# Patient Record
Sex: Male | Born: 1985 | Race: White | Hispanic: No
Health system: Western US, Academic
[De-identification: ages and names within clinical notes are randomized; demographics above are authoritative.]

## PROBLEM LIST (undated history)

## (undated) DIAGNOSIS — G43909 Migraine, unspecified, not intractable, without status migrainosus: Secondary | ICD-10-CM

## (undated) DIAGNOSIS — F909 Attention-deficit hyperactivity disorder, unspecified type: Secondary | ICD-10-CM

## (undated) HISTORY — DX: Migraine, unspecified, not intractable, without status migrainosus: G43.909

## (undated) HISTORY — PX: SURGICAL HX OTHER: 99

## (undated) HISTORY — DX: Attention-deficit hyperactivity disorder, unspecified type: F90.9

## (undated) HISTORY — PX: SHOULDER SURGERY: SHX246

## (undated) NOTE — ED Provider Notes (Signed)
Formatting of this note is different from the original.  URGENT CARE Intake Exam  [facility]    I evaluated Jose Richards in the [facility] via provider in triage.    HISTORY OF PRESENT ILLNESS     HPI  Jose Richards is a 89 year old male who presents to the Urgent Care today with cyst on the R groin. Reports cyst has been present x many years, but in the past few day the cyst has gotten bigger and painful. Denies any systemic complaints     PAST HISTORY     Past medical, surgical, family and social history were reviewed as presented in Patient Snapshot and EPIC records. No changes have been noted.    CURRENT MEDICATIONS:  Previous Medications    ACETAMINOPHEN (TYLENOL ORAL)    Take 750 mg by mouth 3 to 4 times daily as needed    RISPERIDONE (RISPERDAL) 0.25 MG TABLET    Take 1 tablet (0.25 mg) by mouth daily at bedtime , for anxiety and depression.     ALLERGIES:  Allergies   Allergen Reactions    Bee Sting [Venom-Honey Bee] Swelling     Allergies were verified with the patient by RN.    PHYSICAL EXAM     Vital Signs:      Visit Vitals  BP 138/83   Pulse 77   Temp 98.3 F (36.8 C) (Temporal)   Resp 22   Ht 1.702 m (5\' 7" )   Wt 61.2 kg (135 lb)   SpO2 99%   BMI 21.14 kg/m       Vital signs reviewed and show no significant abnormalities, afebrile, not hypoxic.    Constitutional: Generally healthy and well appearing is in no distress.  Head: Normocephalic and atraumatic.  ENT: Voice is normal. Hearing is intact.   Neck: Supple  Pulmonary/Chest: Breathing is unlabored. Able to speak in full sentences.  Neuropsych: Appropriate affect. Grossly neurologically intact. Mentation appears normal.  MSK: Moves all extremities. and No visible signs of injury.  Gait: Walks without assistance.  Skin: Not diaphoretic.     IMPRESSION     Newly enlarging and painful cyst, ?infected sebaceous cyst    PLAN     Medical Screening Exam Completion: I reviewed the patient?s chief  complaint, onset of symptoms, vital signs, level of distress, allergies and current medications.   The patient's RN triage note and any relevant medical history in Epic was reviewed. A focused medical exam was completed.  At this time, I cannot exclude an Emergency Medical Condition [facility] The patient will require further evaluation and care by an on-site urgent care clinician    Orders placed in triage: None    Veto Kemps, PA-C  Emergency Medical Services  [facility]  [facility]  06/08/2023  6:09 PM    This visit consisted of <10 minutes of interaction with the patient.      Electronically signed by Veto Kemps, PA-C at 06/08/2023  6:12 PM PDT

## (undated) NOTE — Progress Notes (Signed)
 Formatting of this note is different from the original.  Video Visit     Identification: Jose Richards is a 35 year old male.    ASSESSMENT/PLAN       Migraine with aura and without status migrainosus, not intractable  Start sumatriptan -25mg  and repeat in 2 hours if needed. Can start at 50mg  if needed. Max dose 100mg . Do not take more than 9 days per month.   Stop alcohol  Consider supplements as listed in AVS  Referral for acupuncture  Follow up in 2 weeks if not improving.       SUBJECTIVE/OBJECTIVE     Reason for Visit Questionnaire   Primary Reason for Visit: Headache   Symptom Description I have had worsening migraines and dizziness/vertigo over the last 3 weeks. I am also getting auras/notice slight brightness and color changes in my vision sometimes. These symptoms are more frequent and more severe than they have been    Prior History of This Concern Yes   Symptom Duration For one to four weeks   Symptom Frequency Constantly   Symptom Getting Better or Worse Worsening   Treatments Patient Has Tried Massage, NSAIDS, hydration. I used to take medication for it but stopped about 8 or so years ago because my symptoms were no longer frequent or disruptive to my life.   Possible Symptom Cause I don't know. I haven't been able to exercise very much recently, but that has never really impacted my headaches before.   Symptom Worries My headaches and vertigo haven't been this bad since they first started when I was a teenager and the vision alterations are new.   Other Medical Concerns No   Other Medical Concerns- Details       Review questionnaire responses    Started 3 weeks ago.   Had worse migraines he has ever had since they started at time of puberty x 2-3 years.   Had many years where he could just take tylenol/excedrin.   Then 3 weeks ago had aggressive symptoms. Migraine pain and vertigo. More severe auras (brightness, color changes). This hasn't happened since he was a teenager.   They are not really going  away. Some days with full day of headache. Sometimes will go away.   Can come and go through the day.   Having mild photophobia and phonophobia mildly today but has been worse at other times.   So far has tried tylenol/Excedrin.   He does have sometimes have vertigo without migraines.   No vomiting.   Normal appetite to slightly better than usual.   No weight loss.   Hasn't been exercising as much as usual for the last month -- due to back spasms and toe pain.   Still walks 4 miles a day.   No recent change to sleep, stress.   Does drink alcohol but has only been having a couple drinks a week over the last 3 weeks. Previously was drinking 2-3 drinks 4-5 nights per week  Has found acupuncture helpful before.     Imitrex and Topiramate in the past.         Provider was located at [facility] for this visit and the patient was located at [facility] in [facility].  A chaperone was not necessary for this exam.   A total of 25 minutes were spent on today?s encounter.  The patient reviewed and understands the [facility] telemedicine consent document and has discussed any related concerns or questions during the visit.  Electronically signed by Madelyn Brunner, MD at 08/08/2023  9:47 AM PDT

## (undated) NOTE — Progress Notes (Signed)
 Formatting of this note is different from the original.  Provider needs to reconcile: None    Patient's main concern: Stomach Problem (Pain/burning constant in the middle spreading to top, )     Other needs or issues hoping to address?   none    Care reminders were reviewed:  Yes: patient declined    Chaperone for Sensitive Physical Exams (In Office):  Not Applicable    No orders of the defined types were placed in this encounter.    eCheck-In Action/Status       eCheck-in Steps Patient Action Taken Status    Personal Information Completed Completed    Insurance  Filtered    Payments Completed Completed    ESign Documents  Not Offered    Questionnaires Completed Completed    Medications Verified Completed    Allergies Verified Completed    Barcode  Completed         Assigned Appointment Questionnaires and Status       Assigned Questionnaires Status    REASON FOR VISIT SELECTION [facility]) Completed           Medications reviewed:       Thu Nov 18, 2022  7:09 AM       Allergies reviewed:         Nov 18, 2022  7:09 AM       Questionnaires completed: Review questionnaire responses  Smoking Tobacco Use: Former  Nurse, mental health by Verna Czech at 01/15/2023 12:03 PM PDT

## (undated) NOTE — Progress Notes (Signed)
 Formatting of this note is different from the original.  Office Visit    Assessment and Plan:       Patient Instructions   For stomach, sounds like irritation, try over the counter pepcid/famotidine, 20 mg-40 mg once or twice a day.   Caution w/ alcohol. Tums good for quick relief, pepcid slower/longer.     Achilles attachment pain, try diclofenac gel and on any painful spots  Re activity, "let pain be your guide".       Health Maintenance   Topic Date Due    Adult HIV Screen (1-time)  Never done    Hep C Screening (1-time)  Never done    Vaccine: COVID-19 (4 - 2023-24 season) 06/18/2022    Depression F/U: [facility] Monitoring Tool  01/06/2023    Blood Pressure Check  09/07/2025    Vaccine: DTaP-Tdap-Td (2 - Td or Tdap) 08/23/2028    FLU VACCINE  Completed     FOLLOW-UP PLAN:    See AVS      Subjective:   Patient ID: Jose Richards is a 27 year old male     Reason for Visit Questionnaire   Primary Reason for Visit: Other   Symptom Description Foot/heal pain, knee pain    Prior History of This Concern Yes   Symptom Duration For more than a month   Symptom Frequency Sometimes   Symptom Getting Better or Worse Staying the same   Treatments Patient Has Tried Ice, stretching, steroid cream   Possible Symptom Cause It's usually just a light pain or ache, but I also get stabbing pain near the outside base of my heal.   Symptom Worries It affects my ability to exercise and it is consistently painful.   Other Medical Concerns Yes   Other Medical Concerns- Details Had stomach burning and abdominal pain for a week, but it resolved itself a few days ago     Review questionnaire responses  Chief Complaint   Patient presents with    Stomach Problem     Stomach Pain/burning constant in the middle spreading to top, for 1-2 weeks     Chronic anxiety  After some alcohol, vomited, mid and upper abd pain, heartburn, went away.   Took Tums, then Pepto Bismol, Tums helped a little.     Achilles insertion area pain, No redness,  swelling, or heat.  \no injury there    Medications, Family and Social History per Epic    Objective:   BP 118/73 (BP Location: Left arm, Patient Position: Sitting)   Pulse 86   Wt 143 lb 14.4 oz (65.3 kg)   SpO2 100%   BMI 22.21 kg/m     Physical Exam  Mild epigastric pain  Right achilles insertion pain, No redness, swelling, or heat.    Full ankle range of motion, no calf muscle pain    A total of 22 minutes were spent on today's encounter.   Time may include direct patient care, reviewing pertinent records & follow-up activity.       Electronically signed by George Hugh, MD at 01/15/2023 12:03 PM PDT

## (undated) NOTE — ED Provider Notes (Signed)
Formatting of this note is different from the original.  SUBJECTIVE:  16 yo male with several days of increasing redness, swelling and pain in right groin region.  He describes having a marble sized nodule in that area for many years that had not been a problem before.  No fevers.      OBJECTIVE:  Visit Vitals  BP 138/83   Pulse 77   Temp 98.3 F (36.8 C) (Temporal)   Resp 22   Ht 1.702 m (5\' 7" )   Wt 61.2 kg (135 lb)   SpO2 99%   BMI 21.14 kg/m     Appears well NAD  Focused exam:  right groin region  - 3-4 cm area of swelling with central fluctuance and redness  - erythema extending superioriorly about 2 additional cm   - very tender    PROCEDURE: Inicision and Drainage of right inguinal sebaceous cyst / abscess  Risks benefits and alternatives explained and Jose Richards gives verbal consent to proceed.  Local anesthesia achieved with 3 ml of 1% lidocaine.  Site is sterilely prepped and draped.  Incision made with #11 blade approximately 2 cm.  grey pus extruded, sent for culture.  Abscess cavity is explored with kelly clamp, loculations broken.  The absces is then packed with sterile gauze.  Jose Richards tolerated the procedure well.  Estimated blood loss is negligible.    ASSESSMENT:  49 yo with infected sebaceous cyst/abscess.   I&D done.       PLAN:  Augmentin to cover likely GI related microbes in that region, cultures pending, wound care, see AVS    Jeannene Patella, MD        Electronically signed by Jeannene Patella, MD at 06/09/2023  6:11 PM PDT

## (undated) NOTE — Assessment & Plan Note (Signed)
 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  Start sumatriptan -25mg  and repeat in 2 hours if needed. Can start at 50mg  if needed. Max dose 100mg . Do not take more than 9 days per month.   Stop alcohol  Consider supplements as listed in AVS  Referral for acupuncture  Follow up in 2 weeks if not improving.   Electronically signed by Madelyn Brunner, MD at 08/08/2023  9:47 AM PDT

## (undated) NOTE — ED Notes (Signed)
Formatting of this note might be different from the original.  Pt here for abscess to R groin. Reports site has always had painless cyst, but recently became inflamed and enlarged and painful. No treatment PTA.  Electronically signed by Verlee Rossetti, RN at 06/08/2023  6:13 PM PDT

## (undated) NOTE — ED Notes (Signed)
Formatting of this note might be different from the original.  Discharged by provider without further intervention by RN.    Chanetta Marshall, RN  Electronically signed by Chanetta Marshall, RN at 06/08/2023 10:41 PM PDT

---

## 2014-08-23 ENCOUNTER — Ambulatory Visit (INDEPENDENT_AMBULATORY_CARE_PROVIDER_SITE_OTHER): Payer: BC Managed Care – PPO | Admitting: Family Medicine

## 2014-08-23 VITALS — BP 106/70 | HR 80 | Temp 98.1°F | Resp 18 | Ht 66.5 in | Wt 130.6 lb

## 2014-08-23 DIAGNOSIS — J208 Acute bronchitis due to other specified organisms: Secondary | ICD-10-CM

## 2014-08-23 MED ORDER — AZITHROMYCIN 250 MG PO TABS: ORAL_TABLET | ORAL | Status: DC

## 2014-08-23 MED ORDER — IPRATROPIUM BROMIDE 0.03 % NA SOLN: 2.0000 | Freq: Four times a day (QID) | NASAL | Status: DC

## 2014-08-23 NOTE — Progress Notes (Signed)
[facility] [address], [place] [phone]  Date:  08/23/2014   Name:  Richard OttoMatthew Reese   DOB:  April 26, 1986   MRN:  [id number]  PCP:  No PCP Per Patient    Chief Complaint: Cough; Headache; and Fatigue   History of Present Illness:  Richard OttoMatthew Reese is a 28 y.o. very pleasant male patient who presents with the following:  Here today as a new patient with illness.   He has been ill for about one month.  He thought he had a sinus infection about one month ago with ST, hoarse voice, cough which could be productive. He got somewhat better, but was never 100%.  Sx have come and gone over the last few weeks. ST is now resolved.  He is not sure if he ran a fever- he has felt warm, but nothing significant.   No unusual GI sx, but he has noted some loose stools  No persistent body aches or chills.  He has had some upper back pain but he sees a chiropractor for back pain.   He has noted some dry eyes.  Did have sneezing but this is now resolved.   He is generally in good health   There are no active problems to display for this patient.   Past Medical History  Diagnosis Date  . Migraine   . ADHD (attention deficit hyperactivity disorder)     Past Surgical History  Procedure Laterality Date  . Shoulder surgery      History  Substance Use Topics  . Smoking status: Current Some Day Smoker  . Smokeless tobacco: Not on file  . Alcohol Use: 3.6 oz/week    0 Not specified, 6 Cans of beer per week    Family History  Problem Relation Age of Onset  . Kidney disease Paternal Grandfather   . Cancer Paternal Grandfather     No Known Allergies  Medication list has been reviewed and updated.  No current outpatient prescriptions on file prior to visit.   No current facility-administered medications on file prior to visit.    Review of Systems:  As per HPI- otherwise negative.   Physical Examination: Filed Vitals:   08/23/14 0843   BP: 106/70  Pulse: 80  Temp: 98.1 F (36.7 C)  Resp: 18   Filed Vitals:   08/23/14 0843  Height: 5' 6.5" (1.689 m)  Weight: 130 lb 9.6 oz (59.24 kg)   Body mass index is 20.77 kg/(m^2). Ideal Body Weight: Weight in (lb) to have BMI = 25: 156.9  GEN: WDWN, NAD, Non-toxic, A & O x 3, looks well, slim HEENT: Atraumatic, Normocephalic. Neck supple. No masses, No LAD.  Bilateral TM wnl, oropharynx normal.  PEERL,EOMI.   Ears and Nose: No external deformity. CV: RRR, No M/G/R. No JVD. No thrill. No extra heart sounds. PULM: CTA B, no wheezes, crackles, rhonchi. No retractions. No resp. distress. No accessory muscle use. EXTR: No c/c/e NEURO Normal gait.  PSYCH: Normally interactive. Conversant. Not depressed or anxious appearing.  Calm demeanor.    Assessment and Plan: Acute bronchitis due to other specified organisms - Plan: azithromycin (ZITHROMAX) 250 MG tablet, ipratropium (ATROVENT) 0.03 % nasal spray  Persistent cough for a month,  Will treat with azithromycin and atrovent as needed for PND.  Asked him to follow-up if not better in the next few days  Signed Abbe AmsterdamJessica Machele Deihl, MD

## 2014-08-23 NOTE — Patient Instructions (Signed)
Use the atrovent nasal spray as needed for your nasal drainage and sore throat.  Use the azithromycin(9antibioic) as directed for your bronchitis.  Let me know if you are not better soon!

## 2015-01-06 ENCOUNTER — Ambulatory Visit (INDEPENDENT_AMBULATORY_CARE_PROVIDER_SITE_OTHER): Payer: BLUE CROSS/BLUE SHIELD

## 2015-01-06 ENCOUNTER — Ambulatory Visit (INDEPENDENT_AMBULATORY_CARE_PROVIDER_SITE_OTHER): Payer: BLUE CROSS/BLUE SHIELD | Admitting: Family Medicine

## 2015-01-06 VITALS — BP 122/72 | HR 70 | Temp 98.0°F | Resp 17 | Ht 66.0 in | Wt 132.0 lb

## 2015-01-06 DIAGNOSIS — R1030 Lower abdominal pain, unspecified: Secondary | ICD-10-CM

## 2015-01-06 DIAGNOSIS — K6289 Other specified diseases of anus and rectum: Secondary | ICD-10-CM | POA: Diagnosis not present

## 2015-01-06 DIAGNOSIS — R194 Change in bowel habit: Secondary | ICD-10-CM

## 2015-01-06 DIAGNOSIS — R312 Other microscopic hematuria: Secondary | ICD-10-CM | POA: Diagnosis not present

## 2015-01-06 DIAGNOSIS — R3129 Other microscopic hematuria: Secondary | ICD-10-CM

## 2015-01-06 DIAGNOSIS — Z800 Family history of malignant neoplasm of digestive organs: Secondary | ICD-10-CM | POA: Diagnosis not present

## 2015-01-06 LAB — POCT UA - MICROSCOPIC ONLY
Bacteria, U Microscopic: NEGATIVE
Casts, Ur, LPF, POC: NEGATIVE
Crystals, Ur, HPF, POC: NEGATIVE
Epithelial cells, urine per micros: NEGATIVE
WBC, Ur, HPF, POC: NEGATIVE
Yeast, UA: NEGATIVE

## 2015-01-06 LAB — POCT CBC
Granulocyte percent: 70 % (ref 37–80)
HCT, POC: 48.2 % (ref 43.5–53.7)
Hemoglobin: 15.9 g/dL (ref 14.1–18.1)
Lymph, poc: 1.9 (ref 0.6–3.4)
MCH, POC: 28.1 pg (ref 27–31.2)
MCHC: 33.1 g/dL (ref 31.8–35.4)
MCV: 85 fL (ref 80–97)
MID (cbc): 0.5 (ref 0–0.9)
MPV: 7.4 fL (ref 0–99.8)
POC Granulocyte: 5.7 (ref 2–6.9)
POC LYMPH PERCENT: 23.7 %L (ref 10–50)
POC MID %: 6.3 %M (ref 0–12)
Platelet Count, POC: 226 10*3/uL (ref 142–424)
RBC: 5.67 M/uL (ref 4.69–6.13)
RDW, POC: 12.4 %
WBC: 8.1 10*3/uL (ref 4.6–10.2)

## 2015-01-06 LAB — POCT URINALYSIS DIPSTICK
Bilirubin, UA: NEGATIVE
Glucose, UA: NEGATIVE
Ketones, UA: NEGATIVE
Leukocytes, UA: NEGATIVE
Nitrite, UA: NEGATIVE
Protein, UA: NEGATIVE
Spec Grav, UA: 1.02
Urobilinogen, UA: 0.2
pH, UA: 7.5

## 2015-01-06 NOTE — Patient Instructions (Addendum)
Proctalgia Fugax Proctalgia fugax is a very short episode of intense rectal pain. It can last from seconds to minutes. It often occurs in the night, and awakens the person from sleep. It is not a sign of cancer.  CAUSES  The cause of this often intense rectal pain is not known. One possible cause may be spasm of the pelvic muscles or of the lowest part of the large intestine.  SYMPTOMS  The pain of proctalgia fugax:  Is intensely severe.  Lasts from only a few seconds to thirty minutes.  Usually awakens the person from sleep. DIAGNOSIS  In order to make sure that there are no other problems, diagnostic tests may be done such as:   Anoscopy. This is a lighted scope that is put into the rectum to look for abnormalities.  Barium enema. X-rays are taken after administering a radio-sensitive material. TREATMENT  A number of things have been used to try to treat this condition, including:  Medications.  Warm baths.  Relaxation techniques.  Gentle massage of the painful area. HOME CARE INSTRUCTIONS   Take all medications exactly as directed.  Follow any prescribed diet.  Follow instructions regarding both rest and physical activity.  Learn progressive relaxation techniques. SEEK IMMEDIATE MEDICAL CARE IF:   Your pain does not get better in the usual amount of time.  You develop any new symptoms. Document Released: 06/29/2001 Document Revised: 12/27/2011 Document Reviewed: 12/05/2008 [facility] Patient Information 2015 [employer] [place]. This information is not intended to replace advice given to you by your health care provider. Make sure you discuss any questions you have with your health care provider. Hematuria Hematuria is blood in your urine. It can be caused by a bladder infection, kidney infection, prostate infection, kidney stone, or cancer of your urinary tract. Infections can usually be treated with medicine, and a kidney stone usually will pass through your urine. If  neither of these is the cause of your hematuria, further workup to find out the reason may be needed. It is very important that you tell your health care provider about any blood you see in your urine, even if the blood stops without treatment or happens without causing pain. Blood in your urine that happens and then stops and then happens again can be a symptom of a very serious condition. Also, pain is not a symptom in the initial stages of many urinary cancers. HOME CARE INSTRUCTIONS   Drink lots of fluid, 3-4 quarts a day. If you have been diagnosed with an infection, cranberry juice is especially recommended, in addition to large amounts of water.  Avoid caffeine, tea, and carbonated beverages because they tend to irritate the bladder.  Avoid alcohol because it may irritate the prostate.  Take all medicines as directed by your health care provider.  If you were prescribed an antibiotic medicine, finish it all even if you start to feel better.  If you have been diagnosed with a kidney stone, follow your health care provider's instructions regarding straining your urine to catch the stone.  Empty your bladder often. Avoid holding urine for long periods of time.  After a bowel movement, women should cleanse front to back. Use each tissue only once.  Empty your bladder before and after sexual intercourse if you are a male. SEEK MEDICAL CARE IF:  You develop back pain.  You have a fever.  You have a feeling of sickness in your stomach (nausea) or vomiting.  Your symptoms are not better in 3 days.  Return sooner if you are getting worse. SEEK IMMEDIATE MEDICAL CARE IF:   You develop severe vomiting and are unable to keep the medicine down.  You develop severe back or abdominal pain despite taking your medicines.  You begin passing a large amount of blood or clots in your urine.  You feel extremely weak or faint, or you pass out. MAKE SURE YOU:   Understand these  instructions.  Will watch your condition.  Will get help right away if you are not doing well or get worse. Document Released: 10/04/2005 Document Revised: 02/18/2014 Document Reviewed: 06/04/2013 [facility] Patient Information 2015 [place], [place]. This information is not intended to replace advice given to you by your health care provider. Make sure you discuss any questions you have with your health care provider.

## 2015-01-06 NOTE — Progress Notes (Signed)
Chief Complaint:  Chief Complaint  Patient presents with  . Abdominal Pain  . Rectal Pain  . Diarrhea    HPI: Richard Reese is a 29 y.o. male who is here for  several complaints.   1. Last couple of months he has had GI discomfort, colon cancer runs in the family, throbbing pain in rectal area, consistency changes in BM, very hard or very loose, he feels like he always has to go to bathroom. He ahs had colonscopy, when he was 13 or 14. He does not see one currently for any GI issues, he has not seen one since age 52. Grandfather was dx with colon cancer in his 30s. Father and mother do not have colon cancer. He has had a lot of stress at work. He works for [employer]. He is a Data processing manager. He has not had any bloody stools.Color of stool has been normal however he has had rectal pain. Denies any STDs. He thought he might have had a hemorrhoid. Stated that he felt like there was a bulge. 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 He had rectal pain A couple of times in a couple of months,  but recently has been more constant so that is why he wants to come in and see what is going on. It was not consistent. In the last week has been more consistent and frequent. There is no family history that he knows of of inflammatory bowel disease, irritable bowel syndrome. However when he called his father he was told that both his grandfather and his father has irritable bowel syndrome.  2. He has migraines with vertigo and nausea and dizziness is associated with this he does not think this is any different.  3. Has lost 4 lbs not extreme , in last 3-4 months. He states it has been unintentional.He has not changed his diet recently.     Wt Readings from Last 3 Encounters:  01/06/15 132 lb (59.875 kg)  08/23/14 130 lb 9.6 oz (59.24 kg)     Past Medical History  Diagnosis Date  . Migraine     . ADHD (attention deficit hyperactivity disorder)    Past Surgical History  Procedure Laterality Date  . Shoulder surgery     History   Social History  . Marital Status: Single    Spouse Name: N/A  . Number of Children: N/A  . Years of Education: N/A   Social History Main Topics  . Smoking status: Former Smoker    Quit date: 11/04/2014  . Smokeless tobacco: Not on file  . Alcohol Use: 3.6 oz/week    0 Standard drinks or equivalent, 6 Cans of beer per week  . Drug Use: Yes    Special: Marijuana  . Sexual Activity: Yes   Other Topics Concern  . None   Social History Narrative   Family History  Problem Relation Age of Onset  . Kidney disease Paternal Grandfather   . Cancer Paternal Grandfather    No Known Allergies Prior to Admission medications   Not on File     ROS: The patient denies fevers, chills, night sweats,  chest pain, palpitations, wheezing, dyspnea on exertion, dysuria, hematuria, melena, numbness, weakness, or tingling.   All other systems have been reviewed and were otherwise negative with the exception of those mentioned in the HPI and as above.  PHYSICAL EXAM: Filed Vitals:   01/06/15 1024  BP: 122/72  Pulse: 70  Temp: 98 F (36.7 C)  Resp: 17   Filed Vitals:   01/06/15 1024  Height:  (1.676 m)  Weight: 132 lb (59.875 kg)   Body mass index is 21.32 kg/(m^2).  General: Alert, no acute distress HEENT:  Normocephalic, atraumatic, oropharynx patent. EOMI, PERRLA Cardiovascular:  Regular rate and rhythm, no rubs murmurs or gallops.  No Carotid bruits, radial pulse intact. No pedal edema.  Respiratory: Clear to auscultation bilaterally.  No wheezes, rales, or rhonchi.  No cyanosis, no use of accessory musculature GI: No organomegaly, abdomen is soft and non-tender, positive bowel sounds.  No masses. Skin: No rashes. Neurologic: Facial musculature symmetric. Psychiatric: Patient is appropriate throughout our interaction. He was a  little bit nervous. Lymphatic: No cervical lymphadenopathy Musculoskeletal: Gait intact. GU-rectal exam was normal. No prostate date hypertrophy, no fissures in the perirectal area, no evidence of internal or external hemorrhoids or anal warts. Testicular exam normal negative for inguinal hernias.   LABS: Results for orders placed or performed in visit on 01/06/15  POCT CBC  Result Value Ref Range   WBC 8.1 4.6 - 10.2 K/uL   Lymph, poc 1.9 0.6 - 3.4   POC LYMPH PERCENT 23.7 10 - 50 %L   MID (cbc) 0.5 0 - 0.9   POC MID % 6.3 0 - 12 %M   POC Granulocyte 5.7 2 - 6.9   Granulocyte percent 70.0 37 - 80 %G   RBC 5.67 4.69 - 6.13 M/uL   Hemoglobin 15.9 14.1 - 18.1 g/dL   HCT, POC 84.6 96.2 - 53.7 %   MCV 85.0 80 - 97 fL   MCH, POC 28.1 27 - 31.2 pg   MCHC 33.1 31.8 - 35.4 g/dL   RDW, POC 95.2 %   Platelet Count, POC 226 142 - 424 K/uL   MPV 7.4 0 - 99.8 fL  POCT UA - Microscopic Only  Result Value Ref Range   WBC, Ur, HPF, POC neg    RBC, urine, microscopic 0-4    Bacteria, U Microscopic neg    Mucus, UA moderate    Epithelial cells, urine per micros neg    Crystals, Ur, HPF, POC neg    Casts, Ur, LPF, POC neg    Yeast, UA neg   POCT urinalysis dipstick  Result Value Ref Range   Color, UA yellow    Clarity, UA clear    Glucose, UA neg    Bilirubin, UA neg    Ketones, UA neg    Spec Grav, UA 1.020    Blood, UA tr-intact    pH, UA 7.5    Protein, UA neg    Urobilinogen, UA 0.2    Nitrite, UA neg    Leukocytes, UA Negative      EKG/XRAY:   Primary read interpreted by Dr. Conley Rolls at [facility]. ? Ureter stones vs bladder phleboltihs   ASSESSMENT/PLAN: Encounter Diagnoses  Name Primary?  . Lower abdominal pain Yes  . Rectal pain   . Bowel habit changes   . Microscopic hematuria   . Family history of colon cancer    Pleasant 29 year old gentleman with vague lower abdominal symptoms possibly consistent with ureter stones which were incidentally found versus possible IBS  symptoms He had a history of abdominal symptoms and was seen by [place] as a GI specialist has a young child. His father and grandfather both have irritable bowel syndrome. His  grandfather also had colon cancer in his mid 1730s. He declines to be referred to a GI specialist at this time for further evaluation for family history of colon cancer. I will not treat him right now for her stones unless x-rays come back confirming it. If it is positive then will consider Flomax. He states he has had microscopic hematuria in the past, has not seen urology for this effort. Was a former smoker. Labs pending: CMP, lipase, also will turn in a hemosure, and I will order a UA with micro for repeat urine testing in one week. If he continues to have hematuria without evidence of renal stone then he needs to be referred to urology. Follow-up with lab send official x-ray results by phone.   Gross sideeffects, risk and benefits, and alternatives of medications d/w patient. Patient is aware that all medications have potential sideeffects and we are unable to predict every sideeffect or drug-drug interaction that may occur.  Hamilton CapriLE, Roberta Angell PHUONG, DO 01/06/2015 12:56 PM

## 2015-01-07 LAB — COMPLETE METABOLIC PANEL WITH GFR
AST: 20 U/L (ref 0–37)
Alkaline Phosphatase: 69 U/L (ref 39–117)
BUN: 14 mg/dL (ref 6–23)
CO2: 32 mEq/L (ref 19–32)
Calcium: 10.5 mg/dL (ref 8.4–10.5)
Creat: 0.7 mg/dL (ref 0.50–1.35)
GFR, Est Non African American: >89 mL/min
Glucose, Bld: 103 mg/dL — ABNORMAL HIGH (ref 70–99)
Potassium: 4.7 mEq/L (ref 3.5–5.3)
Sodium: 142 mEq/L (ref 135–145)
Total Bilirubin: 0.4 mg/dL (ref 0.2–1.2)

## 2015-01-07 LAB — COMPLETE METABOLIC PANEL WITHOUT GFR
ALT: 16 U/L (ref 0–53)
Albumin: 5.6 g/dL — ABNORMAL HIGH (ref 3.5–5.2)
Chloride: 103 meq/L (ref 96–112)
GFR, Est African American: >89 mL/min
Total Protein: 7.9 g/dL (ref 6.0–8.3)

## 2015-01-07 LAB — LIPASE: Lipase: 22 U/L (ref 0–75)

## 2015-01-09 ENCOUNTER — Encounter: Payer: Self-pay | Admitting: Family Medicine

## 2015-01-16 ENCOUNTER — Other Ambulatory Visit (INDEPENDENT_AMBULATORY_CARE_PROVIDER_SITE_OTHER): Payer: BLUE CROSS/BLUE SHIELD

## 2015-01-16 DIAGNOSIS — R312 Other microscopic hematuria: Secondary | ICD-10-CM

## 2015-01-16 DIAGNOSIS — R3129 Other microscopic hematuria: Secondary | ICD-10-CM

## 2015-01-29 ENCOUNTER — Telehealth: Payer: Self-pay | Admitting: Family Medicine

## 2015-01-29 LAB — POCT URINALYSIS DIPSTICK
Bilirubin, UA: NEGATIVE
Glucose, UA: NEGATIVE
Ketones, UA: NEGATIVE
Leukocytes, UA: NEGATIVE
Nitrite, UA: NEGATIVE
Protein, UA: NEGATIVE
Spec Grav, UA: <=1.005
Urobilinogen, UA: 0.2
pH, UA: 6.5

## 2015-01-29 LAB — POCT UA - MICROSCOPIC ONLY
Bacteria, U Microscopic: NEGATIVE
Casts, Ur, LPF, POC: NEGATIVE
Crystals, Ur, HPF, POC: NEGATIVE
Epithelial cells, urine per micros: NEGATIVE
Mucus, UA: NEGATIVE
RBC, urine, microscopic: NEGATIVE
WBC, Ur, HPF, POC: NEGATIVE
Yeast, UA: NEGATIVE

## 2015-01-29 LAB — IFOBT (OCCULT BLOOD): IFOBT: NEGATIVE

## 2015-01-29 NOTE — Telephone Encounter (Signed)
Dr. Conley RollsLe reviewed his labs on 3/24, and sent him a letter with the results and this message:  "Here are your labs as we discussed over the phone.  Your electrolytes, kidneys, liver, pancreas are normal.Please follow up as directed."

## 2015-01-29 NOTE — Telephone Encounter (Signed)
Pt calling about labs done on 3/31. I am consulting with lab about this.

## 2015-01-29 NOTE — Telephone Encounter (Signed)
Pt saw dr Conley RollsLe on 3/21. He had labs and it doesn't look like they were ever reviewed. Can you review them in her absence please? Thanks!

## 2015-01-29 NOTE — Telephone Encounter (Signed)
Please call patient with his lab results. States that he was seen weeks ago and still has not heard anything.   [phone]

## 2015-01-30 ENCOUNTER — Telehealth: Payer: Self-pay | Admitting: Family Medicine

## 2015-01-30 ENCOUNTER — Other Ambulatory Visit: Payer: Self-pay | Admitting: Radiology

## 2015-01-30 DIAGNOSIS — R3129 Other microscopic hematuria: Secondary | ICD-10-CM

## 2015-01-30 NOTE — Telephone Encounter (Signed)
-----   Message from Lenell Antuhao P Le, DO sent at 01/29/2015 11:30 PM EDT ----- Regarding: FW: Still has some trace microscopic blood in urine, recommend urology referral if he wants referral then we can refer him and if not then we will not. Thanks Dr Conley RollsLe ----- Message -----    From: Johnnette LitterErin M Cardwell, CMA    Sent: 01/29/2015   1:52 PM      To: Thao P Le, DO

## 2015-01-30 NOTE — Telephone Encounter (Signed)
Made referral. Pt notified.

## 2015-01-30 NOTE — Telephone Encounter (Signed)
Patient states that if your ok with him not going that would be fine BUT if you really think he need to go then he will go but he's been watching his urine to make sure there is no urine

## 2015-01-30 NOTE — Telephone Encounter (Signed)
-----   Message from Lenell Antuhao P Le, DO sent at 01/29/2015 11:27 PM EDT ----- Regarding: FW: Negative occult blood in stool ----- Message -----    From: Lab in [facility]    Sent: 01/07/2015   2:28 AM      To: Thao P Le, DO

## 2015-01-30 NOTE — Telephone Encounter (Signed)
Spoke with patient gave him results of stool test and urine

## 2015-01-30 NOTE — Telephone Encounter (Signed)
Let him know I would recommend it since he continues to have microscopic blood in urine. Please refer him.

## 2015-02-03 NOTE — Telephone Encounter (Signed)
I see no labs done 3/31. There was the OV on 3/21, and then another on 4/13.  See previous note regarding the lab results from the 3/21 visit. On 4/13 he had:   iFOBT (NEGATIVE) UA with micro (NORMAL)  The tests on 4/13 were resulted IN THE OFFICE, so I'm not sure why he didn't get those results while he was here, unless he took the iFOBT kit home.

## 2015-02-04 NOTE — Telephone Encounter (Signed)
This has already been cleared up with pt.

## 2016-11-29 IMAGING — CR DG ABDOMEN 1V
1 series · 1 of 1 positions shown · non-contrast
Comparison: None.

CLINICAL DATA: Lower abdominal pain, rectal pain, bowel habits have
changed

EXAM:
ABDOMEN - 1 VIEW

[AP]
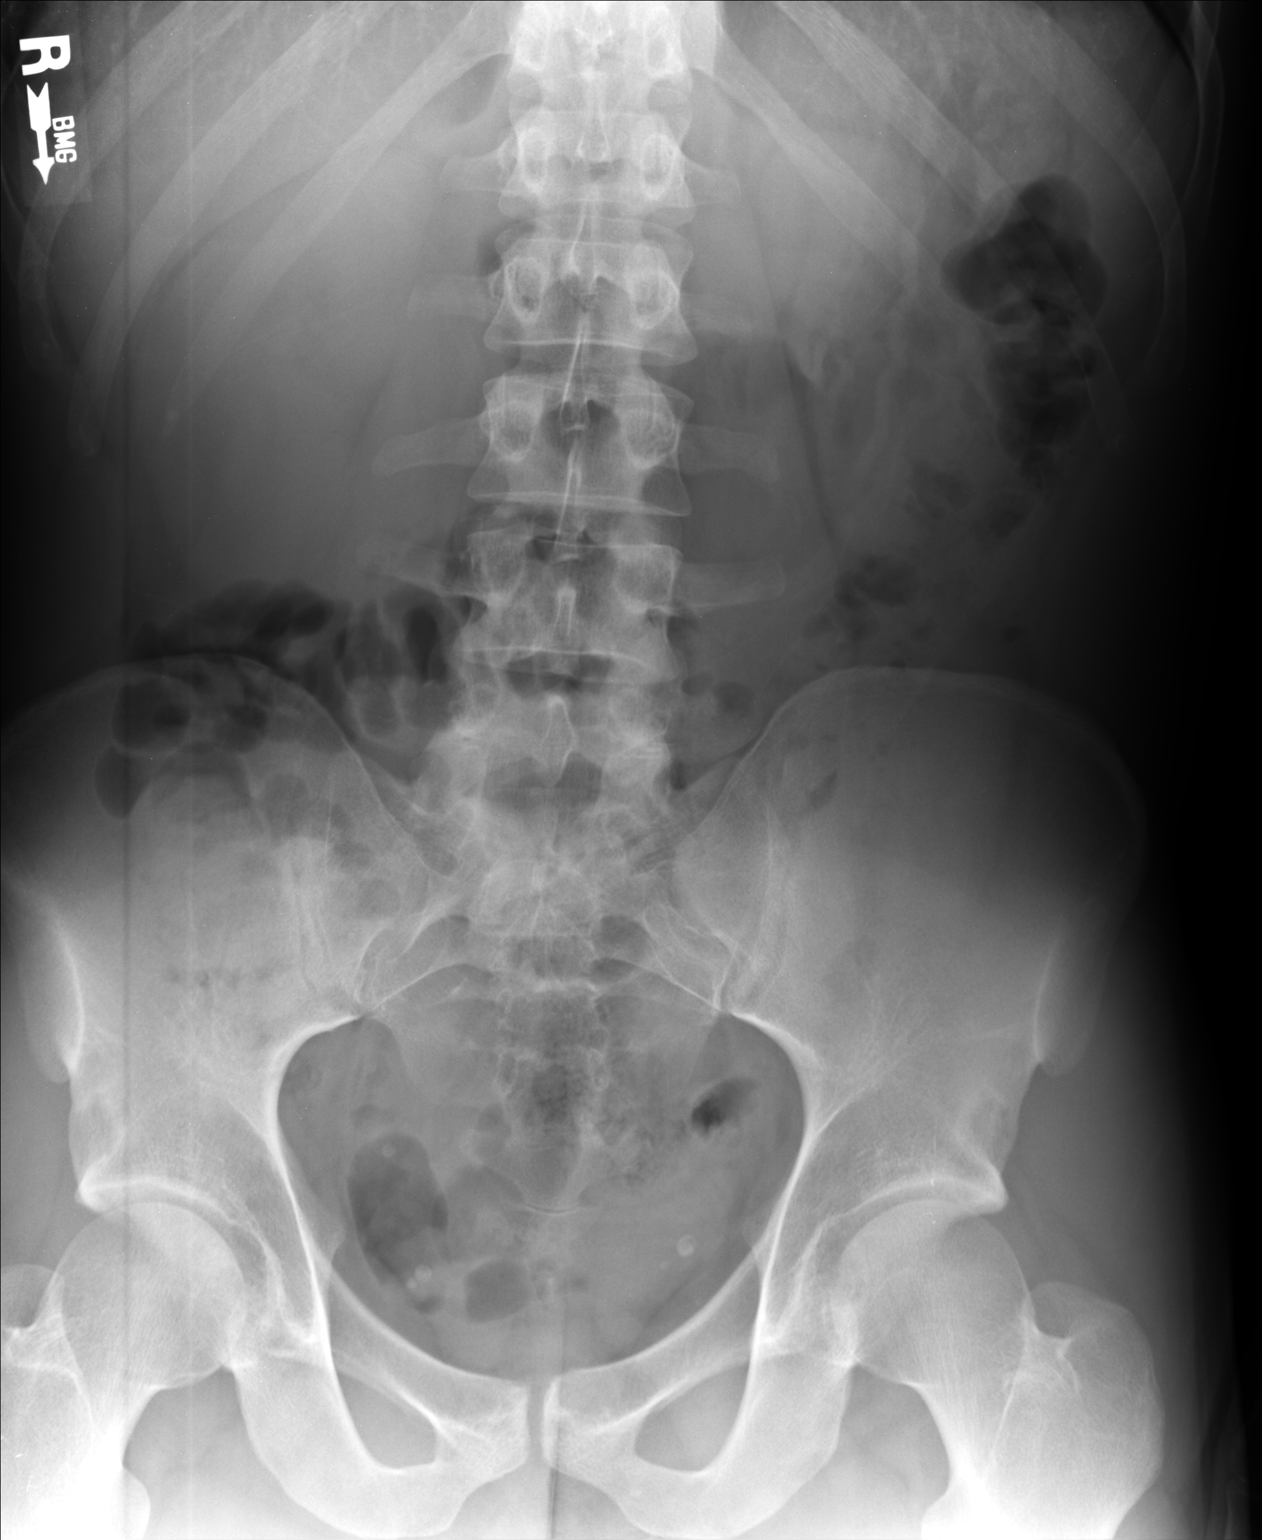

[1 of 1 positions shown; findings below may reference images not displayed]

FINDINGS: The bowel gas pattern is normal. There are multiple pelvic
phleboliths. No radio-opaque calculi or other significant
radiographic abnormality are seen.
IMPRESSION: Negative.

## 2018-08-23 ENCOUNTER — Encounter (INDEPENDENT_AMBULATORY_CARE_PROVIDER_SITE_OTHER): Payer: Self-pay | Admitting: Medical

## 2018-08-23 ENCOUNTER — Ambulatory Visit (INDEPENDENT_AMBULATORY_CARE_PROVIDER_SITE_OTHER): Payer: BLUE CROSS/BLUE SHIELD | Admitting: Medical

## 2018-08-23 VITALS — BP 137/88 | HR 72 | Temp 98.6°F | Resp 10 | Ht 66.25 in | Wt 138.0 lb

## 2018-08-23 DIAGNOSIS — Z13220 Encounter for screening for lipoid disorders: Secondary | ICD-10-CM

## 2018-08-23 DIAGNOSIS — G43109 Migraine with aura, not intractable, without status migrainosus: Secondary | ICD-10-CM | POA: Insufficient documentation

## 2018-08-23 DIAGNOSIS — Z23 Encounter for immunization: Secondary | ICD-10-CM

## 2018-08-23 DIAGNOSIS — F330 Major depressive disorder, recurrent, mild: Secondary | ICD-10-CM | POA: Insufficient documentation

## 2018-08-23 DIAGNOSIS — Z808 Family history of malignant neoplasm of other organs or systems: Secondary | ICD-10-CM

## 2018-08-23 DIAGNOSIS — Z0000 Encounter for general adult medical examination without abnormal findings: Secondary | ICD-10-CM

## 2018-08-23 DIAGNOSIS — F419 Anxiety disorder, unspecified: Secondary | ICD-10-CM

## 2018-08-23 DIAGNOSIS — Z119 Encounter for screening for infectious and parasitic diseases, unspecified: Secondary | ICD-10-CM

## 2018-08-23 DIAGNOSIS — Z8661 Personal history of infections of the central nervous system: Secondary | ICD-10-CM | POA: Insufficient documentation

## 2018-08-23 DIAGNOSIS — E559 Vitamin D deficiency, unspecified: Secondary | ICD-10-CM

## 2018-08-23 DIAGNOSIS — Z6822 Body mass index (BMI) 22.0-22.9, adult: Secondary | ICD-10-CM

## 2018-08-23 DIAGNOSIS — R110 Nausea: Secondary | ICD-10-CM

## 2018-08-23 DIAGNOSIS — Z87820 Personal history of traumatic brain injury: Secondary | ICD-10-CM | POA: Insufficient documentation

## 2018-08-23 NOTE — Progress Notes (Signed)
Reason for Visit: wellness    Refills? NO  Referral? NO  Letter or Form? NO  Lab Results? NO    HEALTH MAINTENANCE:  Has the patient had this done since their last visit?  Cervical screening/PAP: N/A  Mammo: N/A  Colon Screen: N/A    Have you seen a specialist since your last visit: No    Vaccines Due? Yes, Flu and Tdap (both today)    HM Due:   Health Maintenance   Topic Date Due    HIV Screening  05/15/2002    Tetanus Vaccine  05/15/2006    Influenza Vaccine (1) 07/18/2018    Depression Monitoring (PHQ-9)  09/22/2018    Pneumococcal Vaccine: Pediatrics (0-5 years) and At-Risk Patients (6-64 years)  Aged Out       PCP Verified?  Yes, none established        Vaccine Screening Questions    Interpreter: No    1. Are you allergic to Latex? NO    2.  Have you had a serious reaction or an allergic reaction to a vaccine?  NO    3.  Currently have a moderate or severe illness, including fever?  NO    4.  Ever had a seizure or any neurological problem associated with a vaccine? (DTaP/TDaP/DTP pertinent) NO    5.  Is patient receiving any live vaccinations today? (Varicella-Chickenpox, MMR-Measles/Mumps/Rubella, Zoster-Shingles, Flumist, Yellow Fever) NOTE: oral rotavirus is exempt  NO    If YES to any of the questions above - Do NOT give vaccine.  Consult with RN or provider in clinic.  (#5 can be YES if all Live vaccine questions are answered NO)    If NO to all questions above - Patient may receive vaccine.    6.  Do you need to receive the Flu vaccine today? YES - Additional Flu Questions  Flu Vaccine Screening Questions:    Ever had a serious allergic reaction to eggs?  NO    Ever had Guillain-Barre syndrome associated with a vaccine? NO    Less than 6 months old? NO    If YES to any of the Flu questions above - NO Flu Vaccine to be given.  Patient may consult provider as needed.    If NO to all questions above - Patient may receive Flu Shot (IM)    Is the patient requesting Flumist? NO    If between 6 months and 33  years of age, was flu vaccine received last year?  N/A  If NO to above question:   Children who are receiving influenza vaccine for the first time - administer 2 doses of the current influenza vaccine (separated by at least 4 weeks).          All patients are encouraged to wait 15 minutes before leaving after receiving any vaccine.    VIS given 08/23/2018 by Holley Raring, [place].      Vaccine given today without initial adverse effect. [place], [place]

## 2018-08-23 NOTE — Progress Notes (Signed)
S:  Jose Richards is a 32 year old male who presents today for a wellness exam and would like to discuss a few other issues.     (R11.0) Nausea  For 2-4 weeks now intermittently.   Having some nausea in the morning, has trouble eating.   Not having any headache symptoms.   Not too much more stress than normal.   No fevers.   Usually has 2 loose stools in the morning.   No blood in stool.   Thinks he might have had food poisoning a few months ago, came on and went away suddenly.   No unexplained weight loss recently.    Sometimes has heartburn symptoms.     (E55.9) Vitamin D deficiency  Previous level at 11.   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   Notes that he was slightly outside criteria for bipolar disorder previously.   More anhedonia lately, lack of motivation.   Has had side effects in the past with medication.   Counseling: has done talk therapy in the past but not recently.   Has good social support with his girlfriend.   Tai chi also helps.     Family hx of HTN  Usually blood pressure is 120/75 range.     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    Tried Imitrex but only helped a little and doesn't like to take medication  Doesn't feel headaches impacting quality of life at this point.      Review of Systems   Constitutional: Positive for appetite change and fatigue. Negative for activity change, chills, diaphoresis, fever and unexpected weight change.   HENT: Negative for sore throat and trouble swallowing.    Respiratory: Negative for chest tightness and shortness of breath.    Cardiovascular: Negative for chest pain and palpitations.   Gastrointestinal: Positive for nausea and vomiting. Negative for abdominal distention, abdominal pain, blood in stool, constipation, diarrhea and rectal pain.     Patient Active Problem List    Diagnosis    Mild episode of recurrent major depressive disorder ([place])    Vitamin D deficiency    H/O encephalitis- at age 51    Migraine with aura and without status migrainosus, not intractable    H/O concussion       No outpatient medications prior to visit.     No facility-administered medications prior to visit.        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     (Z11.9) Screening examination for infectious disease  Plan: HIV Antigen and Antibody Screen, GC and         Chlamydia Nucleic Acid Testing, CANCELED: GC         and Chlamydia Nucleic Acid Testing    (Z13.220) Screening, lipid  Plan:  Lipid Panel    (G43.109) Migraine with aura and without status migrainosus, not intractable  Plan: doing well currently.  Can consider referral to headache clinic if not improving.     (Z80.8) Family history of skin cancer  Plan: REFERRAL TO DERMATOLOGY    Diagnosis, treatment, risks and alternatives discussed with patient and questions answered. Return to clinic if symptoms increase, persist, or worsen.    I spent a total time of 25 minutes face-to-face with the patient, of which more than 50% was spent counseling as outlined in this note.

## 2018-08-23 NOTE — Patient Instructions (Addendum)
-consider [facility] for individual talk therapy  -consider omeprazole 20mg  before bed to see if this helps with nausea  -can read on [place] for headache prevention      Leading a Healthy Life  Six tips to help improve your health and wellness     This explains how these 6 basic guidelines may improve your health and wellness:    Eat well to give your body the energy it needs.    Stay or get active.    A healthy mind is part of a healthy body.    Practice safe living habits.    Keep your mind and body free of harmful drugs and alcohol.    Get regular health care.     Tip #1: Eat well to give your body the energy it needs.   Your body needs nutritious foods to stay strong and healthy.   Here are some general eating guidelines:    Have 2 servings of fish or other seafood 2 times a week (1 serving = 4 ounces).    If you eat dairy products, choose low-fat (1%) or nonfat ones.    If you eat meat, cut down on the amount. Replace it with plant-based foods such as beans, whole grains, fruits and vegetables, and nuts and seeds.    Have less than 1,500 mg of sodium (salt) a day.    Cut down on junk food like alcohol, fatty foods, chips, candy, and other sweets.     Tip #2: Stay or get active.   Exercise for at least 30 minutes at a time, 3 times a week. Regular physical activity can help you:    Live longer and feel better    Be stronger and more flexible    Build strong bones    Prevent depression    Strengthen your immune system    Maintain a healthy body weight     Tip #3: Remember: A healthy mind is part of a healthy body.   A good state of mind can help you make healthy choices. Here are a few tips for keeping your mind healthy:    Reduce stress in your life.    Make some time every day for things that are fun.    Get enough sleep. Lack of sleep reduces how well you can concentrate, increases mood swings, and raises your risk of having a car accident.    Ask your health care  provider for help if you feel depressed or anxious for more than several days in a row.     Tip #4: Practice safe living habits.   Accidents and Injuries       Accidents and injuries are the 5th leading cause of death in the [place]    Accidents in the home cause thousands of permanent injuries every year.     The most common accidents are fires, falls, and drowning. To help yourself and your family stay safe:    Install smoke detectors on each floor of your home.    Make sure everyone in your family knows how to swim   Stay safe on the road:  o Wear a seatbelt.   o Do not ride with someone who has been drinking or taking drugs.   o Do not speak on a cell phone or send, read, or write text messages while you are driving.   o Wear a helmet when you ride a bicycle or motorcycle.   o Get  enough sleep at night, and do not drive when you are tired.     Hand Hygiene   Protect yourself from germs by washing your hands often. Always wash your hands:    After you change a diaper or use the toilet    Before you start and after you finish preparing food     Tip #5: Keep your mind and body free of harmful drugs and alcohol.   Tobacco causes more health problems than any other substance. These problems include lung disease, heart disease, and many types of cancer. The nicotine in tobacco is the most addictive and widely used drug.   Too much alcohol can cause damage to your liver, heart, brain, bones, and other body tissues. Being under the influence of alcohol also increases your chance of being injured in an accident.    Alcohol can cause fetal alcohol syndrome in your children if you drink regularly when you are pregnant.   Street drugs, like marijuana, cocaine, methamphetamine, heroin, or pain pills not prescribed by your doctor can harm your health. They may be mixed with harmful substances, and using them can cause people to put themselves in dangerous situations.     Tip #6: Get regular health care.   Many people  think they need to see the doctor only when they are sick. But, health care providers can also help you stay healthy.    Find a health care provider who works with you to manage your health.    Ask your health care provider what diseases you are at risk for. Learn what you can do to prevent or control them.    Get yourself and your family immunized against life-threatening diseases.

## 2018-08-24 DIAGNOSIS — F419 Anxiety disorder, unspecified: Secondary | ICD-10-CM | POA: Insufficient documentation

## 2018-08-24 LAB — LIPID PANEL
Cholesterol (LDL): 97 mg/dL (ref <130)
Cholesterol/HDL Ratio: 2.6
HDL Cholesterol: 72 mg/dL (ref >39)
Non-HDL Cholesterol: 112 mg/dL (ref 0–159)
Total Cholesterol: 184 mg/dL (ref <200)
Triglyceride: 73 mg/dL (ref <150)

## 2018-08-24 LAB — LIPASE: Lipase: 25 U/L (ref <70)

## 2018-08-24 LAB — COMPREHENSIVE METABOLIC PANEL
ALT (GPT): 23 U/L (ref 10–64)
AST (GOT): 28 U/L (ref 9–38)
Albumin: 5.2 g/dL (ref 3.5–5.2)
Alkaline Phosphatase (Total): 61 U/L (ref 35–109)
Anion Gap: 8 (ref 4–12)
Bilirubin (Total): 0.4 mg/dL (ref 0.2–1.3)
Calcium: 10.2 mg/dL (ref 8.9–10.2)
Carbon Dioxide, Total: 31 meq/L (ref 22–32)
Chloride: 100 meq/L (ref 98–108)
Creatinine: 0.75 mg/dL (ref 0.51–1.18)
GFR, Calc, African American: >60 mL/min/{1.73_m2} (ref >59)
GFR, Calc, European American: >60 mL/min/{1.73_m2} (ref >59)
Glucose: 84 mg/dL (ref 62–125)
Potassium: 3.9 meq/L (ref 3.6–5.2)
Protein (Total): 7.3 g/dL (ref 6.0–8.2)
Sodium: 139 meq/L (ref 135–145)
Urea Nitrogen: 10 mg/dL (ref 8–21)

## 2018-08-24 LAB — HIV ANTIGEN AND ANTIBODY SCRN
HIV Antigen and Antibody Interpretation: NONREACTIVE
HIV Antigen and Antibody Result: NONREACTIVE

## 2018-08-24 LAB — CBC (HEMOGRAM)
Hematocrit: 43 % (ref 38–50)
Hemoglobin: 14.8 g/dL (ref 13.0–18.0)
MCH: 28.8 pg (ref 27.3–33.6)
MCHC: 34.1 g/dL (ref 32.2–36.5)
MCV: 85 fL (ref 81–98)
Platelet Count: 243 10*3/uL (ref 150–400)
RBC: 5.13 10*6/uL (ref 4.40–5.60)
RDW-CV: 11.9 % (ref 11.6–14.4)
WBC: 7.99 10*3/uL (ref 4.3–10.0)

## 2018-08-24 LAB — GC&CHLAM NUCLEIC ACID DETECTN
Chlam Trachomatis Nucleic Acid: NEGATIVE
N.Gonorrhoeae(GC) Nucleic Acid: NEGATIVE

## 2018-08-24 LAB — TSH WITH REFLEXIVE FREE T4: TSH with Reflexive Free T4: 3.432 u[IU]/mL (ref 0.400–5.000)

## 2018-08-24 NOTE — Progress Notes (Signed)
SUBJECTIVE:    Jose Richards is a 32 year old male here for a wellness/preventive health care visit.  Additional concerns: see attached note    Social History     Patient does not qualify to have social determinant information on file (likely too young).   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  Grew up in [place], [place].  Living in [place] for 2 years.  Lives with his girlfriend.      Diet:  3 meals per day plus 1-2 snacks.  Well balanced, lean protein, whole grains, veggies, 1-2 pieces of fruit per day.     Exercise:  20 minute HIIT routine 3-4 times per week plus 3 mile runs on the weekends.         History sections of chart reviewed and updated today: yes    Patient Active Problem List    Diagnosis Date Noted    Anxiety [F41.9] 08/24/2018    Mild episode of recurrent major depressive disorder ([place]) [F33.0] 08/23/2018    Vitamin D deficiency [E55.9] 08/23/2018    H/O encephalitis- at age 27 [Z86.61] 08/23/2018    Migraine with aura and without status migrainosus, not intractable [G43.109] 08/23/2018    H/O concussion [Z87.820] 08/23/2018       No current outpatient medications on file.     No current facility-administered medications for this visit.     SEXUAL HISTORY  Sexual activity: yes, single partner, contraception  Number of sex partners in the past year: 1  Sexual concerns: No    PHYSICAL EXAM:  BP 137/88    Pulse 72    Temp 98.6 F (37 C) (Temporal)    Resp 10    Ht 5' 6.25" (1.683 m)    Wt 138 lb (62.6 kg)    SpO2 96%    BMI 22.11 kg/m    General: no acute distress  Skin: Skin color, texture, turgor normal. No rashes or concerning lesions  Head: Normocephalic. No masses, lesions, tenderness or abnormalities  Eyes: Lids/periorbital skin normal, Conjunctivae/corneas clear, PERRL, EOM's intact  Ears: External ears normal. Canals clear. TM's normal.  Nose: normal  Oropharynx: normal  Teeth: normal dentition for age  Neck: supple. No  adenopathy. Thyroid symmetric, normal size, without nodules  Lungs: clear to auscultation  Heart: normal rate, regular rhythm and no murmurs, clicks, or gallops  Abd: soft, non-tender. BS normal. No masses or organomegaly  GU: deferred  Rectal: deferred  Prostate: deferred  Spine: Back symmetric, no deformity; ROM normal; No CVA tenderness.  Ext: Normal, without deformities, edema, or skin discoloration, radial and DP pulses 2+ bilaterally  Neuro:  Grossly normal to observation, gait normal  -----------------------------------------    ASSESSMENT AND PLAN:  Health Maintenance   Topic Date Due    Depression Monitoring (PHQ-9)  09/22/2018    Tetanus Vaccine  08/23/2028    Influenza Vaccine  Completed    HIV Screening  Completed    Pneumococcal Vaccine: Pediatrics (0-5 years) and At-Risk Patients (6-64 years)  Aged Out     Immunizations or studies due: Yes, see below  HIV screening offered: yes    (Z00.00) Routine medical exam  (primary encounter diagnosis)    (Z23) Need for vaccination  Plan: influenza vaccine quadrivalent PF (adult) 0.5         mL IM - 29924, Tdap vacc (adult) 0.5 mL IM -         26834,     (  R11.0) Nausea  Plan: CBC, Comprehensive Metabolic Panel, Lipase, TSH        with Reflexive Free T4    (E55.9) Vitamin D deficiency  Plan: Vitamin D (25 Hydroxy)    (Z11.9) Screening examination for infectious disease  Plan: HIV Antigen and Antibody Screen, GC and         Chlamydia Nucleic Acid Testing,     (Z13.220) Screening, lipid  Plan: Lipid Panel      Preventive counseling: STD prevention, skin Ca prevention & skin self-exam, healthy dietary guidelines, proper exercise

## 2018-08-25 LAB — VITAMIN D (25 HYDROXY)
Vit D (25_Hydroxy) Total: 46.9 ng/mL (ref 20.1–50.0)
Vitamin D2 (25_Hydroxy): <1 ng/mL
Vitamin D3 (25_Hydroxy): 46.9 ng/mL

## 2018-11-17 ENCOUNTER — Telehealth (INDEPENDENT_AMBULATORY_CARE_PROVIDER_SITE_OTHER): Payer: Self-pay

## 2018-11-17 ENCOUNTER — Ambulatory Visit (INDEPENDENT_AMBULATORY_CARE_PROVIDER_SITE_OTHER): Payer: Self-pay | Admitting: Medical

## 2018-11-17 NOTE — Telephone Encounter (Signed)
Call placed to patient to discuss behavioral health referral request -- see triage encounter for details. No answer -- LMTCB.    CCR/PSR: Please transfer to Health Navigator, [phone], okay to pass to voicemail.

## 2018-11-17 NOTE — Telephone Encounter (Signed)
Patient calling through Option [facility].  Declined counseling services at November appt, but is struggling now.  Requesting urgent referral.    Declined to speak with nurse at time of call, but I let him know that someone would be reaching out to him later.    Crisis line number provided as well.    Routing to Triage. Please follow up with patient today.    Copying to [employer] and [facility] 1 for referral request Routing to [employer].

## 2018-11-17 NOTE — Telephone Encounter (Signed)
Patient states rough couple of weeks, more stressed, is currently at work, trying to be productive. To follow home care advice.  Is asking for a behavioral health referral be put in, is eagerly looking for someone to talk to.    Reason for Disposition  . Symptoms interfere with work or school    Additional Information  . Negative: Patient attempted suicide  . Negative: Patient is threatening suicide now  . Negative: Patient is threatening to kill a specific person  . Negative: Patient is very confused (disoriented, slurred speech) and no other adult (e.g., friend or family member) available  . Negative: Difficult to awaken or acting very confused (disoriented, slurred speech) and new onset  . Negative: Sounds like a life-threatening emergency to the triager  . Negative: Alcohol use, abuse or dependence, question or problem related to  . Negative: Drug abuse or dependence, question or problem related to  . Negative: Depression and unable to do any of normal activities (e.g., self care, school, work; in [place] to baseline).  . Negative: Bizarre or confused behavior  . Negative: Patient sounds very sick or weak to the triager  . Negative: Fever > 101 F (38.3 C)  . Negative: Sometimes has thoughts of suicide    Protocols used: DEPRESSION-ADULT-[place]

## 2018-11-20 ENCOUNTER — Telehealth (INDEPENDENT_AMBULATORY_CARE_PROVIDER_SITE_OTHER): Payer: Self-pay | Admitting: Medical

## 2018-11-20 DIAGNOSIS — F330 Major depressive disorder, recurrent, mild: Secondary | ICD-10-CM

## 2018-11-20 DIAGNOSIS — F419 Anxiety disorder, unspecified: Secondary | ICD-10-CM

## 2018-11-20 NOTE — Telephone Encounter (Signed)
RETURN CALL: Voicemail - Detailed Message      SUBJECT:  Referral Request/Questions      REASON FOR REFERRAL: Behavioral  Health  NAME OF CLINIC/SPECIALTY: open  PROVIDER: n/a  PHONE: n/a  FAX: n/a  ADDITIONAL INFORMATION: Patient is calling to follow up on the referral he requested for behavioral health. Patient states that he works Tuesday through Saturday for 12 hr days so Monday is his day to be able to get things done, and that he really needs to be able to speak with someone about his mental health. Patient would like to know the status of his referral at this point. Please contact, and leave as detailed voicemail as possible if he is unable to take the call. Patient is also inquiring if it is possible to have someone else write the referral if Dr Ron Agee is unavailable.

## 2018-11-20 NOTE — Telephone Encounter (Signed)
Patient returned. CCR sent to VM.

## 2018-11-21 NOTE — Telephone Encounter (Signed)
Psychiatry referral pended

## 2018-11-21 NOTE — Telephone Encounter (Signed)
Patient returned call regarding referral. He is looking to establish with a counselor, and doesn't know where to start. He was hoping he could be seen here for counseling. I advised that unfortunately our counseling services are currently at capacity (patient is also not being treated here for his depression and/or anxiety so not a good fit for integrated services). Patient read eCare message that I sent with info on [facility] and [facility]. He will start with those and look for a therapist. We also discussed [facility], as sometimes it is hard to find someone within your insurance network so this is a good low-cost option. He would like me to send him info on this via eCare as well. He also is requesting that [place] sign a "general" mental health referral so he has it on file in case he needs it. Updated referral to psychology and selected location as patient preference. Routing to [place] to sign. Thanks!

## 2018-11-21 NOTE — Telephone Encounter (Signed)
Received VM from patient regarding referral request. Called patient back and left detailed message asking him for more information. We need to know if he needs a referral sent to a specific location, or if he just wants a general referral on file for insurance purposes. Asked him to call us back so we can get some more information from him. Also provided the phone number for the crisis line if he needs to speak with someone urgently. Will send patient eCare message as well.    CCR/PSR: Please transfer to Health Navigator, [phone], okay to pass to voicemail.

## 2018-11-21 NOTE — Telephone Encounter (Signed)
Closing this TE as a new TE has been created on 2/3.

## 2018-11-30 ENCOUNTER — Encounter (INDEPENDENT_AMBULATORY_CARE_PROVIDER_SITE_OTHER): Payer: Self-pay

## 2018-12-05 ENCOUNTER — Ambulatory Visit (INDEPENDENT_AMBULATORY_CARE_PROVIDER_SITE_OTHER): Payer: BLUE CROSS/BLUE SHIELD

## 2018-12-05 ENCOUNTER — Ambulatory Visit (INDEPENDENT_AMBULATORY_CARE_PROVIDER_SITE_OTHER): Payer: BLUE CROSS/BLUE SHIELD | Admitting: Family Medicine

## 2018-12-05 VITALS — BP 123/88 | HR 87 | Resp 14 | Wt 141.2 lb

## 2018-12-05 DIAGNOSIS — M79675 Pain in left toe(s): Secondary | ICD-10-CM

## 2018-12-05 DIAGNOSIS — Z6822 Body mass index (BMI) 22.0-22.9, adult: Secondary | ICD-10-CM

## 2018-12-05 NOTE — Patient Instructions (Addendum)
   Can buddy tape toe if it feels better   Wear firm soled shoe or surgical cast shoe   Take advil or alleve and tylenol   Ice, rest, and elevate

## 2018-12-05 NOTE — Result Encounter Note (Signed)
Result reviewed and discussed with patient in clinic. See OV progress note for details.

## 2018-12-05 NOTE — Progress Notes (Signed)
Reason for Visit:   Chief Complaint   Patient presents with   . Other     possible broken toe, x 1 day, swollen and purple         Refills? NO  Referral? NO  Letter or Form? NO  Lab Results? NO    HEALTH MAINTENANCE:  Has the patient had this done since their last visit?  Cervical screening/PAP: N/A  Mammo: N/A  Colon Screen: N/A    Have you seen a specialist since your last visit: No    Vaccines Due? Yes,     HM Due:   Health Maintenance   Topic Date Due   . Depression Monitoring (PHQ-9)  09/22/2018   . DTaP, Tdap, and Td Vaccines (2 - Td) 08/23/2028   . Influenza Vaccine  Completed   . HIV Screening  Completed   . Hepatitis B Vaccine  Aged Out   . Pneumococcal Vaccine: Pediatrics (0-5 years) and At-Risk Patients (6-64 years)  Aged Out       PCP Verified?  Yes, Pcp, Unknown

## 2018-12-05 NOTE — Progress Notes (Signed)
33 yo male here for toe injury. Bumped it in the middle of the night last night. Bruised left fourth toe. Able to ambulate. Limping. XR today with no fracture. Discussed buddy taping and hard soled shoe. F/u in 2 weeks if no improvement.

## 2018-12-05 NOTE — Progress Notes (Signed)
FAMILY MEDICINE OUTPATIENT VISIT    An interpreter was not needed for the visit.   Type of Visit: established Problem Visit  Primary Care Provider:  Lelan Pons Harle Battiest, MD    Byran is a 33 year old male with history of no major medical history, who presents to discuss the following:    [place]  Chief Complaint   Patient presents with   . Other     possible broken toe, x 1 day, swollen and purple     SUBJECTIVE  Toe pain. Left fourth toe. Last night, thinks he banged his toe, on left foot, swollen and purple, lots of pain  Hasn't taken any pain meds. Can walk on it but limping and has pain.     Review of Systems  See HPI.    OBJECTIVE  Vitals:    12/05/18 0819   BP: 123/88   BP Cuff Size: Regular   BP Site: Right Arm   BP Position: Sitting   Pulse: 87   Resp: 14   SpO2: 99%   Weight: 141 lb 3.2 oz (64 kg)     Physical Exam  Vitals signs reviewed.   Constitutional:       General: He is not in acute distress.     Appearance: Normal appearance. He is not ill-appearing.   Musculoskeletal:      Comments: Left foot:  Bones of foot are nontender, no swelling or erythema. There is purple bruising on left fourth toe, over distal phalanx. There is no swelling, bruising, or erythema. He is tender to palpation on most distal part of left fourth toe. No tenderness over other toes. Bones of fourth toe do not feel displaced.    Skin:     Comments: Normal cap refill in toes of left foot.    Neurological:      Mental Status: He is alert.      Comments: No decreased sensation in toes of left foot.    Psychiatric:         Behavior: Behavior normal.         Results  XR left toes done today in clinic:  - I looked at the xray and also called and spoke to radiology and we agreed that there is no acute fracture.     ASSESSMENT AND PLAN  Ayo Smoak is a 33 year old male with the below diagnoses.    1. Pain in toe of left foot  Trauma to toe with ecchymosis x 1 day. XR negative. Discussed rest, ice,  elevation, advil+tylenol, option to buddy tape or wear hard sole shoes. Return in 2 weeks if not better.   - XR TOES 2+ VW LEFT; Future    Return in about 2 weeks (around 12/19/2018) for  , routine follow-up with bryna if possible since he's seen her before.    Osker Mason, MD  [place]  [place] [address]  [phone]    ----------------------------------------------------------  Patient's Medications    No medications on file

## 2019-01-01 NOTE — Progress Notes (Signed)
I have personally discussed the case with the resident during or immediately after the patient visit including review of history, physical exam, diagnosis, and treatment plan. Any additional comments are below or have been added to the above note. and I agree with the assessment and plan of care.

## 2019-01-31 ENCOUNTER — Telehealth (HOSPITAL_BASED_OUTPATIENT_CLINIC_OR_DEPARTMENT_OTHER): Payer: Self-pay | Admitting: Dermatology

## 2019-01-31 NOTE — Telephone Encounter (Signed)
Added pt to defer list, no specific scheduling instructions per MD

## 2019-01-31 NOTE — Telephone Encounter (Signed)
Jose Richards had an live, in-person, appointment scheduled with me in Dermatology on 02/15/2019 which due to the COVID-19 pandemic we may want to defer to minimize risk of transmission.      Chief Complaint for this visit was FSE, family hx of skin cancer    I sent an eCare message to him to CANCEL this live appointment due to the COVID-19 pandemic and that we will put him in the queue for a live in person appointment if/when it is available.      PSS Staff, please call patient and cancel the appointment using the above guidelines.

## 2019-02-15 ENCOUNTER — Encounter (HOSPITAL_BASED_OUTPATIENT_CLINIC_OR_DEPARTMENT_OTHER): Payer: BLUE CROSS/BLUE SHIELD | Admitting: Dermatology

## 2019-02-26 ENCOUNTER — Encounter (INDEPENDENT_AMBULATORY_CARE_PROVIDER_SITE_OTHER): Payer: Self-pay | Admitting: Family Medicine

## 2019-02-26 ENCOUNTER — Telehealth (INDEPENDENT_AMBULATORY_CARE_PROVIDER_SITE_OTHER): Payer: BLUE CROSS/BLUE SHIELD | Admitting: Family Medicine

## 2019-02-26 VITALS — BP 133/77 | HR 102 | Temp 96.3°F | Resp 16

## 2019-02-26 DIAGNOSIS — L720 Epidermal cyst: Secondary | ICD-10-CM

## 2019-02-26 DIAGNOSIS — L089 Local infection of the skin and subcutaneous tissue, unspecified: Secondary | ICD-10-CM

## 2019-02-26 NOTE — Progress Notes (Signed)
Patient Rooming (in-clinic or Telemed): Telemed    Reason for Visit:   Chief Complaint   Patient presents with   . Mass     on back of neck for years.  Recently increased in size, is painful and inflammed         Refills? NO  Referral? NO  Letter or Form? NO  Lab Results? NO    HEALTH MAINTENANCE:  Has the patient had this done since their last visit?  Cervical screening/PAP: N/A  Mammo: N/A  Colon Screen: N/A    Have you seen a specialist since your last visit: No    Vaccines Due? No    HM Due:   Health Maintenance   Topic Date Due   . Hepatitis C Screening  1986-03-20   . Depression Monitoring (PHQ-9)  09/22/2018   . DTaP, Tdap, and Td Vaccines (2 - Td) 08/23/2028   . Influenza Vaccine  Completed   . HIV Screening  Completed   . Hepatitis B Vaccine  Aged Out   . Pneumococcal Vaccine: Pediatrics (0-5 years) and At-Risk Patients (6-64 years)  Aged Out       PCP Verified?  Yes, [place], Zoe Harle Battiest, MD

## 2019-02-26 NOTE — Progress Notes (Signed)
I did not perform the initial consultation. Please refer to Dr. Jason Nest note.    PROCEDURE NOTE    I&D     Preoperative diagnostic impression: abscess  Location and description of lesion: posterior neck, midline, approximately 3 cm  Optional treatments, benefits, and risks discussed.   Informed consent: written consent obtained: YES     Correct Patient using  2 PATIENT IDENTIFIERS YES   Correct SIDE marked YES   Correct SITE marked YES   Correct PROCEDURE YES   Correct EQUIPMENT and EQUIPMENT SETTINGS YES   Completed CONSENT YES, Date: 02/26/2019       Preparation: wiped with isopropyl alcohol  Local anesthesia:  EMLA cream, patient declined injected lidocaine    Description of procedure:    A 1.5 cm linear incision was made with #11 scalpel.  A hemostat was then used to spread tissue and explore cavity.    Findings on exploration: copius pus drainage, no major loculations   Culture of material obtained: NO  The cavity was packed with a gauze strip.  Blood loss: 3 cc  Dressing: gauze bandage (sterile)  Complications: none  The patient tolerated the procedure well.    Wound care instructions: see below (patient care instructions)    Follow up in 2 weeks.

## 2019-02-26 NOTE — Patient Instructions (Addendum)
1. Take out packing on Wednesday ~noon. May be easiest to take it out while in the shower. OK to let water run down incision.   2. Repack the wound with the packing tape as instruction. If you cannot do the packing, it is OK. Make an in-person appointment in ~ week for eval. If the packing goes OK, leave in for 2 days and repack again. Then make televisit or in-person in 2 weeks.    If any fever 100.51F, redness, increasing tenderness call us.         Wound Care  Taking proper care of your wound will help it heal. Yourhealthcareprovider may show you how to clean and dress the wound. He or she will also explain how to tell if the wound is healing normally.If you are unsure of how to take care of the wound, be sure to clarify what dressing to use and how often you should change the bandages.Here are the basic steps.     A wound that's not healing normally may be dark in color or have white streaks.   Wash your hands  Tips for washing your hands include:   Use liquid soap and lather for 3minutes. Scrub between your fingers and under your nails.   Rinse with warm water, keeping your fingers pointing down.   Use a paper towel to dry your hands and to turn off the faucet.  Remove the used dressing  Here are suggestions for removing the dressing:   If dressing changes cause you pain, be sure to take your pain medicine as prescribed by your healthcare provider 30 minutesbeforedressing changes.   Set up your supplies.   Put on disposable gloves if youre dressing a wound for someone else or your wound is infected.   Loosen the tape by pulling gently toward the wound.   Gently take off the old dressing. If the dressing is stuck to the wound, moisten it with saline (if available) or clean water.   If you have a drain or tube in the wound, be careful not to pull on it.   Remove the dressing 1 layer at a time and put it in a plastic bag. Seal the bag and put it in the trash.   Remove your gloves.  Inspect and  dress the wound  Check the wound carefully:   Each time you change the dressing, check the wound carefully to be sure its healing normally by making sure your wound appears to be pink and moist, and is free of infection.    Wash your hands again. Put on a new pair of gloves.   Clean and dress the wound as directed by yourhealthcare provideror nurse.Don't put anything in the wound that is not prescribed or directed by your healthcare provider.If you have a drain or tube, be careful not to pull on it.Make sure to secure the drain or tube as well.   Put all unused supplies in a clean plastic bag. Seal the bag and store it in a clean, dry area between dressing changes.   Be sure to wash your hands again.  Call your healthcare provider  Call your healthcare provider if you see any of the following signs of a problem:   Bleeding that soaks the dressing   Pink fluid weeping from the wound   Increased drainage or drainage that is yellow, yellow-green, or foul-smelling   Increased swelling or pain, or redness or swelling in the skin around the wound   A  change in the color of the wound, or if streaks develop in a direction away from the wound   The area between any stitches opens up   An increase in the size of the wound   A fever of 100.67F (38C) or higher, or as directed by your healthcare provider   Chills, increased fatigue, or a loss of appetite     Date Last Reviewed: 04/17/2016   2000-2018 [employer]. [address] [place] [address]. All rights reserved. This information is not intended as a substitute for professional medical care. Always follow your healthcare professional's instructions.          Abscess (Incision & Drainage)  An abscess is sometimes called a boil. It happens when bacteria get trapped under the skin and start to grow. Pus forms inside the abscess as the body responds to the bacteria. An abscess can happen with an insect bite, ingrown hair, blocked oil  gland, pimple, cyst, or puncture wound.  Your healthcare provider has drained the pus from your abscess. If the abscess pocket was large, your healthcare provider may have put in gauze packing. Your provider will need to remove it on your next visit. He orshe may also replace it at that time. You may not need antibiotics to treat a simple abscess, unless the infection is spreading into the skin around the wound (cellulitis).  The woundwill take about 1 to 2 weeks to heal, depending on the size of the abscess. Healthy tissue will grow from the bottom and sides of the opening until it seals over.  Home care  These tips can help your wound heal:   The wound may drain for the first 2 days. Cover the wound with a clean dry dressing. Change thedressing if it becomes soaked with blood or pus.   If a gauze packing was placed inside the abscess pocket, you may be told to remove it yourself. You may do this in the shower. Once the packing is removed, you should wash the area in the shower, or clean the area as directed by your provider. Continue to do this until the skin opening has closed. Make sure you wash your hands after changing the packing or cleaning the wound.   If you were prescribed antibiotics, take them as directed until they are all gone.   You may use acetaminophen or ibuprofen to control pain, unless another pain medicine was prescribed.If you have liver disease or ever had a stomach ulcer, talk with your doctor before using these medicines.  Follow-up care  Follow up with your healthcare provider, or as advised. If a gauze packing was put in your wound, it should be removed in 1 to 2 days. Check your wound every day for any signs that the infection is getting worse. The signs are listed below.  When to seek medical advice  Call your healthcare provider right away if any of these occur:   Increasing redness or swelling   Red streaks in the skin leading away from the wound   Increasing local pain or  swelling   Continued pus draining from the wound 2 days after treatment   Fever of 100.67F (38C) or higher, or as directed by your healthcare provider   Boil returns when you are at home  Date Last Reviewed: 06/19/2015   2000-2018 [employer]. [address] [place] [address]. All rights reserved. This information is not intended as a substitute for professional medical  care. Always follow your healthcare professional's instructions.

## 2019-02-26 NOTE — Progress Notes (Signed)
I have reviewed the resident's documentation and agree with it.

## 2019-02-26 NOTE — Progress Notes (Signed)
[facility] VISIT    Acute visit note    ID/CC: Jose Richards is a 33 year old male who presents with    HISTORY OF PRESENT ILLNESS/INTERVAL HISTORY:    #Neck cyst  -Has had a cyst on the back of neck for years  -Previously saw Dermatology years ago who diagnosed it as a sebaceous (likely epidermoid) cyst and patient planned to leave it alone unless it bothered him.  -1 week history of enlarging lump on back of neck that is warm, red, swollen, painful. Not draining anything, hurts to flex his neck  -No fevers, chills, n/v, vision changes, neck stiffness  -Has been using ice and cleansing it daily      REVIEW OF SYSTEMS: as discussed in HPI      PAST MEDICAL HISTORY/PROBLEM LIST:  Patient Active Problem List   Diagnosis   . Mild episode of recurrent major depressive disorder ([place])   . Vitamin D deficiency   . H/O encephalitis- at age 69   . Migraine with aura and without status migrainosus, not intractable   . H/O concussion   . Anxiety       MEDICATIONS:  Current Outpatient Medications   Medication Sig Dispense Refill   . Cholecalciferol (Vitamin D-3) 125 MCG (5000 UT) tablet Take 1,000 Units by mouth daily.     . Docosahexaenoic Acid (DHA OMEGA 3 OR) Take by mouth.       No current facility-administered medications for this visit.        ALLERGIES:  NKDA    PHYSICAL EXAM:  Vitals:    02/26/19 0928   BP: 133/77   BP Cuff Size: Regular   BP Site: Right Arm   BP Position: Sitting   Pulse: (!) 102   Resp: 16   Temp: 96.3 F (35.7 C)   TempSrc: Temporal   SpO2: 95%       GEN: Healthy-appearing, well-nourished male resting comfortably in no acute distress.  SKIN: Warm and dry. Posterior neck - 4cm area of induration with overlying erythema, warmth, small area of fluctuance, TTP  HEENT: No conjunctival injection or scleral icterus. Mucus membranes moist.  CV: Tachycardic, warm/well perfused  LUNGS: Normal effort, speaking in full sentences  MSK: Good muscle bulk.   NEURO:  CN II-XII grossly intact.  PSYCH: Alert and pleasantly conversant with appropriate affect.     ASSESSMENT/PLAN:  Jose Richards is a 33 year old male who presents with:    Infected epidermoid cyst  [place] evidence of heterogenous material within cyst. Discussed risks and benefits of I&D for short term pain relief. Will still need to follow up for removal of cyst sac.  See separate procedure note above  -     INCISION & DRAINAGE ABSCESS SIMPLE/SINGLE    Other orders  -     Geraldine:  2 week follow up    Roxanne Mins, DO  [facility]) PGY-3  Pager [phone]

## 2019-02-26 NOTE — Progress Notes (Signed)
I saw and evaluated the patient. Infected epidermal cyst, I&D performed with ~57mm opening made, ~2cc purulent drainage. Packed and given supplies and instructions for re-packing. If home packing not successful, will RTC in 1 week. If able to pack at home, will f/u in 2 weeks. RTC at any time if any questions or systemic signs of infection  I was present for the entire procedure.

## 2019-02-27 ENCOUNTER — Telehealth (INDEPENDENT_AMBULATORY_CARE_PROVIDER_SITE_OTHER): Payer: BLUE CROSS/BLUE SHIELD | Admitting: Family Medicine

## 2019-03-13 ENCOUNTER — Encounter (INDEPENDENT_AMBULATORY_CARE_PROVIDER_SITE_OTHER): Payer: Self-pay | Admitting: Family Medicine

## 2019-03-13 ENCOUNTER — Telehealth (INDEPENDENT_AMBULATORY_CARE_PROVIDER_SITE_OTHER): Payer: BLUE CROSS/BLUE SHIELD | Admitting: Family Medicine

## 2019-03-13 DIAGNOSIS — L089 Local infection of the skin and subcutaneous tissue, unspecified: Secondary | ICD-10-CM

## 2019-03-13 DIAGNOSIS — L729 Follicular cyst of the skin and subcutaneous tissue, unspecified: Secondary | ICD-10-CM

## 2019-03-13 NOTE — Progress Notes (Signed)
[facility] Telemedicine Encounter    I conducted this encounter from home via secure, live, face-to-face video conference with the patient. Jose Richards was located at home with  Girlfriend  is in another room.  Prior to the interview, the risks and benefits of telemedicine were discussed with the patient and verbal consent was obtained.        SUBJECTIVE:    33 yo who had I&D of infected epidermal cyst on back of neck, 1.5 cm incision made made 02/26/19.  This is his 2 week f/u.    Cyst was 1 cm for 2 years, derm said removal would be cosmetic    2-3 weeks ago became 7 cm, very painful, couldn't move head.   Gven some gauze, repacked for 8-9 days, was healing, kept covered for 4-5 days.      Girlfriend noted some material that came out of wound which he describes as "blister-like."    Patient states it is healing well and he has pictures to send that show the healing.    Immn:  Tdap 2019        OBJECTIVE:  Well-appearing young man in no distress      Sent photos during visit.  Photos reviewed photos reviewed and they show excellent healing.  The redness around the wound has resolved.  A small area is open.    ASSESSMENT/PLAN:    Status post incision and drainage of infected epidermoid cyst.  Healing well.  Discussed removal of cyst if it appears to remain after healing.Cyst wall may have been disrupted sufficiently to not need further procedure.  He does not otherwise need to follow-up.    Follow up: prn

## 2019-05-03 ENCOUNTER — Encounter (INDEPENDENT_AMBULATORY_CARE_PROVIDER_SITE_OTHER): Payer: Self-pay | Admitting: Family Practice

## 2019-05-03 ENCOUNTER — Ambulatory Visit (INDEPENDENT_AMBULATORY_CARE_PROVIDER_SITE_OTHER): Payer: BLUE CROSS/BLUE SHIELD

## 2019-05-03 ENCOUNTER — Ambulatory Visit (INDEPENDENT_AMBULATORY_CARE_PROVIDER_SITE_OTHER): Payer: BLUE CROSS/BLUE SHIELD | Admitting: Family Practice

## 2019-05-03 VITALS — BP 128/80 | HR 71 | Temp 99.0°F | Resp 12 | Wt 142.0 lb

## 2019-05-03 DIAGNOSIS — S43002A Unspecified subluxation of left shoulder joint, initial encounter: Secondary | ICD-10-CM

## 2019-05-03 NOTE — Progress Notes (Signed)
I have personally discussed the case with the resident during or immediately after the patient visit including review of history, physical exam, diagnosis, and treatment plan. Hx of left shoulder subluxation--recent. similar hx on the opposite shoulder (right) and had surgery for labrum tear. Plan for XR today given hx of fall on outstretched arm. No gross weakness or ROM problems on exam today. He will start PT and follow-up in 4-6 weeks. NSAIDs and icing prn.     Carollee Sires, MD  Department of [facility] [place] [facility] [place]

## 2019-05-03 NOTE — Patient Instructions (Signed)
Patient Education     Shoulder Dislocation, Reduced    A shoulder is dislocated when a strong force injures, and possibly tears the ligaments that hold the shoulder joint together. The bones that make up the joint then move apart and become stuck out of place. The joint must be put back in place. Then it will take several weeks for the shoulder to heal. This injury may weaken the ligaments. Weakened ligaments put you at risk for another dislocation. Another dislocation can happen even if you are hit with less force.   Shoulder dislocation is treated with a special type of arm sling called a shoulder immobilizer. This keeps your arm close to your body. This stops the shoulder from dislocating again while the ligaments heal. After a few weeks, you may start an exercise program. This will gradually bring back your range-of-motion and shoulder strength. It will also lower your risk for another dislocation.   Home care  Follow these tips for taking care of yourself at home:    Until your next doctor visit, wear your shoulder immobilizer at all times. Dont take it off at night to sleep. This is because its possible to dislocate your arm again in your sleep. You can take it off to bathe or dress. But dont move your arm away from your body. Keep your arm in the same position that the sling was holding it in until you put the sling back on. During your next visit, ask your doctor how long you should wear the sling.   Apply an ice pack over the injured area for 20 minutes every 1 to 2 hours the first day. You can make an ice pack by putting ice cubes in a plastic bag. Wrap the bag in a thin towel before putting it on your shoulder. Continue with ice packs 3 to 4 times a day for the next 2 to 3 days. Then use the ice as needed to ease pain and swelling.   You may use acetaminophen or ibuprofen to control pain, unless another pain medicine was prescribed. Talk with your healthcare provider before using these medicines if  you have chronic liver or kidney disease. Also talk with your provider if you have had a stomach ulcer or digestive bleeding.   Dont take part in sports or physical education classes until your doctor says its OK.  Follow-up care  Follow up with your healthcare provider, or as advised. You shouldnt wear your shoulder immobilizer or sling for more than a few weeks without taking it off. Keeping it on for a longer time may limit your range-of-motion at the shoulder joint. If you have had several dislocations of the same shoulder, you may have permanent damage to the ligaments. Ask an orthopedic doctor about surgery to prevent another dislocation.   When to seek medical advice  Call your healthcare provider right away if any of these occur:    Another dislocation of your shoulder   Swelling or pain in the shoulder or arm that gets worse   Your fingers become cold, blue, numb or tingly   Fever or chills  StayWell last reviewed this educational content on 06/18/2018   2000-2020 [employer]. [address] [place] [address]. All rights reserved. This information is not intended as a substitute for professional medical care. Always follow your healthcare professional's instructions.

## 2019-05-03 NOTE — Progress Notes (Signed)
[place] VISIT    An interpreter was not needed for the visit.  Type of Visit: established Problem Visit  Primary Care Provider:  Lelan Pons Harle Battiest, MD    Jose Richards is a 33 year old male, with the below problem list, who presents to discuss the following:    Chief Complaint  Shoulder dislocation    HPI/Interval History  -Was running four days ago, and tripped and fell onto left arm, and felt shoulder dislocate, and then pop back into place ~20 seconds later. Since then, has been using ibuprofen 400 mg BID, icing, resting, wearing a sling.  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   Musculoskeletal:        Left shoulder pain   Skin: Negative for wound.   Neurological: Negative for weakness and numbness.     I personally reviewed and confirmed the past medical history in the record with the patient today, as follows:    Patient Active Problem List   Diagnosis   . Mild episode of recurrent major depressive disorder ([place])   . Vitamin D deficiency   . H/O encephalitis- at age 22   . Migraine with aura and without status migrainosus, not intractable   . H/O concussion   . Anxiety     No past surgical history on file.  OB History   No obstetric history on file.     Family History     Problem (# of Occurrences) Relation (Name,Age of Onset)    Depression (1) Brother    Hypertension (2) Mother, Father    Lipids/Cholesterol (1) Mother    Mental Illness (1) Brother    Skin Cancer (2) Maternal Grandfather: basal cell, Paternal Grandfather: basal cell          Social History     Socioeconomic History   . Marital status: Significant other     Spouse name: Not on file   . Number of children: Not on file   . Years of education: Not on file   . Highest education level: Not on file    Occupational History   . Not on file   Social Needs   . Financial resource strain: Not on file   . Food insecurity     Worry: Not on file     Inability: Not on file   . Transportation needs     Medical: Not on file     Non-medical: Not on file   Tobacco Use   . Smoking status: Former Smoker     Packs/day: 0.00     Quit date: 10/16/2014     Years since quitting: 4.5   . Smokeless tobacco: Never Used   Substance and Sexual Activity   . Alcohol use: Yes     Drinks per session: 1 or 2     Comment: 3 - 5 x /week   . Drug use: Yes   . Sexual activity: Not on file   Lifestyle   . Physical activity     Days per week: Not on file     Minutes per session: Not on file   . Stress: Not on file   Relationships   . Social connections     Talks on phone: Not on file  Gets together: Not on file     Attends religious service: Not on file     Active member of club or organization: Not on file     Attends meetings of clubs or organizations: Not on file     Relationship status: Not on file   . Intimate partner violence     Fear of current or ex partner: Not on file     Emotionally abused: Not on file     Physically abused: Not on file     Forced sexual activity: Not on file   Other Topics Concern   . Not on file   Social History Narrative    08/2018:  Works as a Automotive engineer with [employer].  Grew up in [place], [place].  Living in [place] for 2 years.  Lives with his girlfriend.      Diet:  3 meals per day plus 1-2 snacks.  Well balanced, lean protein, whole grains, veggies, 1-2 pieces of fruit per day.     Exercise:  20 minute HIIT routine 3-4 times per week plus 3 mile runs on the weekends.         Current Outpatient Medications:   .  Cholecalciferol (Vitamin D-3) 125 MCG (5000 UT) tablet, Take 1,000 Units by mouth daily., Disp: , Rfl:   .  Docosahexaenoic Acid (DHA OMEGA 3 OR), Take by mouth., Disp: , Rfl:   .  ibuprofen 200 MG tablet, Take 400 mg by mouth every 8 hours as needed., Disp: , Rfl:   .  MAGNESIUM OR, Take by  mouth., Disp: , Rfl:     Review of patient's allergies indicates:  No Known Allergies    Objective  BP 128/80   Pulse 71   Temp 99 F (37.2 C) (Temporal)   Resp 12   Wt 142 lb (64.4 kg)   SpO2 98%   BMI 22.75 kg/m   Physical Exam   Constitutional: He is oriented to person, place, and time and well-developed, well-nourished, and in no distress.   HENT:   Head: Normocephalic and atraumatic.   Eyes: Conjunctivae and EOM are normal.   Pulmonary/Chest: Effort normal.   Musculoskeletal:      Comments: Left shoulder: full ROM, no pain with abduction to 180 degrees, 5/5 strength of all UE muscle groups; mild TTP of anterior shoulder, apprehension test negative   Neurological: He is alert and oriented to person, place, and time.   Skin: Skin is warm and dry. No rash noted. No erythema.   Psychiatric: Affect and judgment normal.     Assessment and Plan   Jose Richards is a 33 year old male with the below diagnoses. I discussed the following with Carolin Coy Gutman:    [phone]) Subluxation of left shoulder joint, initial encounter  (primary encounter diagnosis) -patient with improving symptoms with conservative management (NSAIDs, ice, rest).  Minimal pain currently.  Given dislocation was sustained secondary to trauma, will get an x-ray. Discussed continued conservative management. 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 RTC if symptoms don't continue to improve, and would consider an MRI at that time.  Plan: XR SHOULDER 2+ VW LEFT    Follow up as needed, or if symptoms worsen.    Sheral Apley, MD  [facility]  [place] [address]  [phone]    Appendix:  Active Ambulatory Problems     Diagnosis  Date Noted   . Mild episode of recurrent major depressive disorder ([place]) 08/23/2018   . Vitamin D deficiency 08/23/2018   . H/O encephalitis- at age 76 08/23/2018   . Migraine with aura and without status  migrainosus, not intractable 08/23/2018   . H/O concussion 08/23/2018   . Anxiety 08/24/2018     Resolved Ambulatory Problems     Diagnosis Date Noted   . No Resolved Ambulatory Problems     Past Medical History:   Diagnosis Date   . Migraines        Patient's Medications   New Prescriptions    No medications on file   Previous Medications    CHOLECALCIFEROL (VITAMIN D-3) 125 MCG (5000 UT) TABLET    Take 1,000 Units by mouth daily.    DOCOSAHEXAENOIC ACID (DHA OMEGA 3 OR)    Take by mouth.    IBUPROFEN 200 MG TABLET    Take 400 mg by mouth every 8 hours as needed.    MAGNESIUM OR    Take by mouth.   Modified Medications    No medications on file   Discontinued Medications    No medications on file

## 2019-05-03 NOTE — Progress Notes (Signed)
Patient Rooming (in-clinic or Telemed): in clinic    Reason for Visit:   Chief Complaint   Patient presents with    Fall     sunday, landed on L shoulder         Refills? NO  Referral? NO  Letter or Form? NO  Lab Results? NO    HEALTH MAINTENANCE:  Has the patient had this done since their last visit?  Cervical screening/PAP: N/A  Mammo: N/A  Colon Screen: N/A    Have you seen a specialist since your last visit: No    Vaccines Due? No    HM Due:   Health Maintenance   Topic Date Due    Hepatitis C Screening  1986/05/28    Depression Monitoring (PHQ-9)  09/22/2018    Influenza Vaccine (1) 07/19/2019    DTaP, Tdap, and Td Vaccines (2 - Td) 08/23/2028    HIV Screening  Completed    Hepatitis B Vaccine  Aged Out    Pneumococcal Vaccine: Pediatrics (0-5 years) and At-Risk Patients (6-64 years)  Aged Out       PCP Verified?  Yes, [place], Zoe Harle Battiest, MD

## 2019-05-06 NOTE — Progress Notes (Signed)
I did not see the patient, but have reviewed the findings. I have also reviewed the resident's documentation and agree with it

## 2019-07-29 ENCOUNTER — Other Ambulatory Visit: Payer: Self-pay

## 2019-12-18 ENCOUNTER — Ambulatory Visit (INDEPENDENT_AMBULATORY_CARE_PROVIDER_SITE_OTHER): Payer: BLUE CROSS/BLUE SHIELD | Admitting: Medical

## 2019-12-18 VITALS — BP 133/86 | HR 79 | Temp 99.2°F | Resp 14 | Wt 138.0 lb

## 2019-12-18 DIAGNOSIS — S39011A Strain of muscle, fascia and tendon of abdomen, initial encounter: Secondary | ICD-10-CM

## 2019-12-18 NOTE — Patient Instructions (Signed)
Patient Education     Muscle Strain in the Abdomen  A muscle strain is a stretching or tearing of the muscle fibers. It is also called a pulled muscle. The abdomen is protected by a thick wall of muscle in the front and sides. These muscles help with twisting and bending forward. Too much coughing, lifting heavy objects, or sudden jerking movements can sometimes cause a muscle strain in the abdomen. This causes pain that is worse when you move. The area may also feel tender or look swollen and bruised.  Home care   Apply an ice pack over the injured area for 15 to 20 minutes every 3 to 6 hours. Do this for the first 24 to 48 hours. You can make an ice pack by filling a plastic bag that seals at the top with ice cubes and then wrapping it with a thin towel. Be careful not to injure your skin with the ice treatments. Ice should never be applied directly to skin. Continue the use of ice packs for relief of pain and swelling as needed. After 48 hours, apply heat (warm shower or warm bath) for 15 to 20 minutes several times a day, or alternate ice and heat.   You may use over-the-counter pain medicine to control pain, unless another pain medicine was prescribed. If you have liver or kidney disease, a stomach ulcer or gastrointestinal bleeding, talk with your healthcare provider before using these medicines.  Follow-up care  Follow up with your healthcare provider, or as advised.  Call 911  Call 911 if you have:   Weakness, lightheaded, or faint   Chest pain  When to seek medical advice  Call your healthcare provider right away if any of these occur:   Pain gets worse or moves to the right lower abdomen, just below the waistline   Fever of 100.4F (38C) or higher, or chills, or as directed by your healthcare provider   Vomiting   Severe abdominal pain that spreads to the back or toward the groin   Blood in the urine   Unexpected vaginal bleeding in women  StayWell last reviewed this educational content on  02/15/2017   2000-2020 [employer] [address], [place] [address]. All rights reserved. This information is not intended as a substitute for professional medical care. Always follow your healthcare professional's instructions.

## 2019-12-18 NOTE — Progress Notes (Signed)
S:  Jose Richards is a 34 year old male who presents today for concerns about tenderness in his lower groin for the past week.   Worried about a hernia.    Looking for a job, picked up playing the piano during quarantine.      Has been doing yoga, weight lifting.   Was doing a hip flexor stretch and then felt a deep soreness.    Pain is 3/10 at the worst, feels annoying.    No pain with using the restroom.    Did not notice a bulge at all.      Review of Systems   Constitutional: Negative for appetite change, chills, diaphoresis, fatigue and fever.   Respiratory: Negative for chest tightness and shortness of breath.    Cardiovascular: Negative for chest pain.   Gastrointestinal: Positive for abdominal pain. Negative for abdominal distention, constipation, diarrhea and nausea.     Patient Active Problem List   Diagnosis   . Mild episode of recurrent major depressive disorder ([place])   . Vitamin D deficiency   . H/O encephalitis- at age 49   . Migraine with aura and without status migrainosus, not intractable   . H/O concussion   . Anxiety       Outpatient Medications Prior to Visit   Medication Sig Dispense Refill   . Cholecalciferol (Vitamin D-3) 125 MCG (5000 UT) tablet Take 1,000 Units by mouth daily.     . Docosahexaenoic Acid (DHA OMEGA 3 OR) Take by mouth.     [name] ibuprofen 200 MG tablet Take 400 mg by mouth every 8 hours as needed.     [name] MAGNESIUM OR Take by mouth.       No facility-administered medications prior to visit.        Review of patient's allergies indicates:  No Known Allergies    OBJECTIVE:  BP 133/86   Pulse 79   Temp 99.2 F (37.3 C) (Temporal)   Resp 14   Wt 138 lb (62.6 kg)   SpO2 99%   BMI 22.11 kg/m   Physical Exam   Constitutional: He is well-developed, well-nourished, and in no distress.   Pulmonary/Chest: Effort normal and breath sounds normal.   Abdominal: Soft. Bowel sounds are normal. He exhibits no distension and no mass. There is abdominal tenderness. There is no  guarding.   Minimal tenderness to lower abdomen bilaterally     ASSESSMENT/PLAN:    1. Strain of abdominal wall, initial encounter  Discussed rest, tylenol, heat.  Reassured no evidence of hernia but can consider ultrasound if pain doesn't improve  - US Abdomen Complete; Future    Diagnosis, treatment, risks and alternatives discussed with patient and questions answered. Return to clinic if symptoms increase, persist, or worsen.

## 2019-12-24 ENCOUNTER — Encounter (INDEPENDENT_AMBULATORY_CARE_PROVIDER_SITE_OTHER): Payer: Self-pay | Admitting: Family Medicine

## 2019-12-24 ENCOUNTER — Ambulatory Visit (INDEPENDENT_AMBULATORY_CARE_PROVIDER_SITE_OTHER): Payer: No Typology Code available for payment source | Admitting: Family Medicine

## 2019-12-24 ENCOUNTER — Ambulatory Visit (INDEPENDENT_AMBULATORY_CARE_PROVIDER_SITE_OTHER): Payer: Self-pay | Admitting: Family Medicine

## 2019-12-24 VITALS — BP 130/79 | HR 95 | Temp 99.0°F

## 2019-12-24 DIAGNOSIS — N341 Nonspecific urethritis: Secondary | ICD-10-CM

## 2019-12-24 DIAGNOSIS — R300 Dysuria: Secondary | ICD-10-CM

## 2019-12-24 LAB — PR U/A AUTO DIPSTICK ONLY, ONSITE
Bilirubin, Urine: NEGATIVE
Glucose, Urine: NEGATIVE mg/dL
Ketones, URN: NEGATIVE mg/dL
Leukocytes: NEGATIVE
Nitrite, URN: NEGATIVE
Protein: NEGATIVE mg/dL
Specific Gravity, Urine: 1.01 (ref 1.005–1.030)
Urobilinogen, URN: 0.2 E.U./dL (ref 0.2–1.0)
pH, URN: 6.5 (ref 5.0–8.0)

## 2019-12-24 MED ORDER — AZITHROMYCIN 250 MG OR TABS
1000.0000 mg | ORAL_TABLET | Freq: Every day | ORAL | 0 refills | Status: DC
Start: 2019-12-24 — End: 2020-01-07

## 2019-12-24 NOTE — Telephone Encounter (Signed)
Patient c/o bladder pain and penis pain (burning) at rest (no dysuria), flank pain, and urinary frequency x 3 days. Denies penile discharge. Unclear about past hx of kidney stones.    Plan: Appointment made for today. Advised patient to call 911 or go to closest emergency room if life threatening symptoms occur.    Reason for Disposition  . Side (flank) or lower back pain present    Additional Information  . Negative: Shock suspected (e.g., cold/pale/clammy skin, too weak to stand, low BP, rapid pulse)  . Negative: Sounds like a life-threatening emergency to the triager  . Negative: Unable to urinate (or only a few drops) and bladder feels very full  . Negative: Swollen scrotum  . Negative: Pain in scrotum or testicle that persists > 1 hour  . Negative: Fever > 100.5 F (38.1 C)  . Negative: Patient sounds very sick or weak to the triager  . Negative: Vomiting  . Negative: Severe pain with urination    Protocols used: URINATION PAIN - MALE-ADULT - [place]

## 2019-12-24 NOTE — Telephone Encounter (Signed)
Routing to Nurse Triage to please advise UTI symptoms and questions.

## 2019-12-24 NOTE — Patient Instructions (Addendum)
Thank you for choosing the Urgent Care Clinic at [facility] for your visit today.  Your Provider today was Dr Boston Service    If you have lab tests that have not been completed at the time of your discharge, you will be contacted when they're completed.  This may be done either via eCare or via the phone.  If you had x-rays today you were given a preliminary diagnosis from the provider.  If something of significance difference is seen by the radiologist you will be contacted and those differences will be described or explained.  If you sign up with eCare you will be able to view the x-ray reports and the lab work within a few days of their completion.    If you have questions about your care you can call the [facility] clinic during office hours at [phone], or contact the 24 hour number at [phone].      Please follow-up with your primary care provider in 3-5 days if you are not improving. If you are in need of a primary care provider and would like to establish care with [facility], please call [phone].    If your symptoms get worst - please go to the nearest emergency room.    Your specific instructions are listed below:    Keep eye on symptoms, possible urethritis - possible nongonococcal urethritisPatient Education     Urethritis in Men     An inflamed urethra can cause pain during urination.   The urethra is the tube that runs from the bladder through the penis. When the urethra is inflamed, it is called urethritis. The urethra becomes swollen and causes burning pain when you urinate. Other symptoms of urethritis may include itching or tingling of the penis, or pus discharge from the penis. You may also have pain with sex and masturbation. Some men may not have symptoms.   What causes urethritis?  Urethritis can be caused by a bacterial or viral infection. Such an infection can lead to conditions such as a urinary tract infection (UTI) or sexually transmitted infections (STIs).  Urethritis can also be caused by injury or sensitivity or allergy to chemicals in lotions and other products.   How is urethritis diagnosed?  Your healthcare provider will examine you and ask about your symptoms and health history. You may also have one or more of the following tests:    Urine test. This is doneto take urine samples and have them checked for problems.   Blood test. A blood sample is taken and checked for problems.   Urethral discharge culture. A fluid sample is taken from inside the urethra and checked under a microscope. To get the sample, a cotton swab is inserted into the opening of the penis and into the urethra.   Cystoscopy. This test lets the healthcare provider look for problems in the urinary tract. The test uses a thin, flexible tube (cystoscope). It has a light and camera attached. The scope is inserted into the urethra.  How is urethritis treated?  Treatment depends on the cause of urethritis. If it's from a bacterial infection, medicines that fight infection (antibiotics) will be given. Your healthcare provider can tell you more about your treatment options. In the meantime, your symptoms can be treated. To ease pain and swelling, anti-inflammatory medicines, such as ibuprofen, may be given. If not treated, symptoms may get worse. Also, scar tissue can form in the urethra, causing it to narrow.   When to call your healthcare  provider  Call your healthcare provider right away if you have any of the following:    Fever of 100.4 F( 38C ) or higher, or as directed by your provider   Blood in the urine or semen   Burning pain with urination   Increased urge to urinate   Discharge from the penis   Itching, soreness, or swelling in the penis or groin   Pain during sex or masturbation   Inability to urinate  Preventing STIs  When it comes to sex, it's important to take care and be safe. Any sexual contact with the penis, vagina, anus, or mouth can spread an STI. The only sure  way to prevent STIs is not having sex. But there are ways to make sex safer. Use a latex condom each time you have sex. And talk with your partner about STIs before you have sex.   [employer] last reviewed this educational content on 11/18/2018   2000-2020 [employer]. [address], [place] [address]. All rights reserved. This information is not intended as a substitute for professional medical care. Always follow your healthcare professional's instructions.             Will notify you of urine culture in 2-4 days  Will notify you of Gonorrhea and chlamydia thru ecare    Drink water 8-10 cups of fluid/day

## 2019-12-24 NOTE — Progress Notes (Signed)
Patient Referred By: No ref. provider found  Patient's PCP: Trevor Mace, MD     Subjective:  Patient is a 34 year old male, here to discuss Urine Problem (Patient complains of urinary frequency x 3 days)    The following portions of the patient's history were reviewed with the patient and updated as appropriate: past surgical history, past social history and past family history.    HPI 34 year old male comes in with urinary frequency, feels burning suprapubic area to penis, tip ; no penile discharge, no fever or chills, had headache this AM; one regular partner, last intercourse 6 weeks, drinks 8-10 oz of coffee/day, no antihistamines or decongestants; urine looked cloudy yesterday    Review of Systems - had UTI 7 years ago, has IBS, last few days has BM once a day, usually 3x/day; fam hx of IBS, colon cancer in grandfather      Objective:  Physical Exam  Vitals signs and nursing note reviewed.   Constitutional:       General: He is not in acute distress.     Appearance: Normal appearance. He is normal weight. He is not ill-appearing, toxic-appearing or diaphoretic.   HENT:      Head: Normocephalic and atraumatic.   Eyes:      General: No scleral icterus.        Right eye: No discharge.         Left eye: No discharge.      Conjunctiva/sclera: Conjunctivae normal.   Neck:      Musculoskeletal: Normal range of motion and neck supple. No neck rigidity or muscular tenderness.      Vascular: No carotid bruit.   Cardiovascular:      Rate and Rhythm: Normal rate and regular rhythm.   Pulmonary:      Effort: Pulmonary effort is normal. No respiratory distress.      Breath sounds: Normal breath sounds. No stridor. No wheezing, rhonchi or rales.   Chest:      Chest wall: No tenderness.   Abdominal:      General: Bowel sounds are normal.      Tenderness: There is no abdominal tenderness. There is no right CVA tenderness or left CVA tenderness.   Lymphadenopathy:      Cervical: No cervical adenopathy.    Neurological:      Mental Status: He is alert.       Results for orders placed or performed in visit on 12/24/19   U/A AUTO DIPSTICK ONLY, ONSITE   Result Value Ref Range    Color, Urine YELLOW     Clarity, URN CLEAR     Glucose, Urine NEGATIVE NEG mg/dL    Bilirubin, Urine NEGATIVE NEG    Ketones, URN NEGATIVE NEG mg/dL    Specific Gravity, Urine 1.010 1.005 - 1.030    Occult Blood, URN TRACE (A) NEG    pH, URN 6.5 5.0 - 8.0    Protein NEGATIVE NEG-TRACE mg/dL    Urobilinogen, URN 0.2 0.2 - 1.0 E.U./dL    Nitrite, URN NEGATIVE NEG    Leukocytes NEGATIVE NEG   GC and Chlamydia Nucleic Acid Testing   Result Value Ref Range    GC&Chlam NA Spec Desc Urine     Chlam Trachomatis Nucleic Acid  NRN    N.Gonorrhoeae(GC) Nucleic Acid  NRN        Assessment and Plan:   Diagnoses and all orders for this visit:    Dysuria  -  U/A AUTO DIPSTICK ONLY, ONSITE  -     GC and Chlamydia Nucleic Acid Testing  -     Urine Culture    see AVS for instructions

## 2019-12-24 NOTE — Telephone Encounter (Signed)
RETURN CALL: Voicemail - Detailed Message      SUBJECT:  Talk to nurse.     MESSAGE: Patient requested to speak with Care Team. He wants to find out if he can come in tomorrow, or if clinic feels he should go to UC today. Possibly UTI related. Attempted to reach clinic, but clinic was busy assisting others. Please assist. Thank you.

## 2019-12-25 LAB — GC&CHLAM NUCLEIC ACID DETECTN
Chlam Trachomatis Nucleic Acid: NEGATIVE
N.Gonorrhoeae(GC) Nucleic Acid: NEGATIVE

## 2019-12-25 NOTE — Progress Notes (Signed)
Gonorrhea and chlamydia screening is negative for infection.

## 2019-12-26 ENCOUNTER — Telehealth (HOSPITAL_BASED_OUTPATIENT_CLINIC_OR_DEPARTMENT_OTHER): Payer: Self-pay | Admitting: Student in an Organized Health Care Education/Training Program

## 2019-12-26 ENCOUNTER — Ambulatory Visit: Payer: Self-pay

## 2019-12-26 LAB — URINE C/S: Culture: NO GROWTH

## 2019-12-26 NOTE — Telephone Encounter (Signed)
Regarding: [place] pt - difficult urinating   ----- Message from Neville Route, Coordinator sent at 12/26/2019  4:51 PM PST -----  [facility] pt - difficult urinating

## 2019-12-26 NOTE — Progress Notes (Signed)
eCare msg sent:    Your urine culture showed no growth, making bladder infection very unlikely.     If your symptoms worsen or fail to improve within 48 hours on the antibiotic, please see your PCP ASAP or return to the clinic

## 2019-12-26 NOTE — Telephone Encounter (Signed)
Urinary retention started yesterday; sometimes doesn't fully empty; frequency; took Zithromax as prescribed 48 hours ago    Paged on call MD at 1700, 1807- connected Pt with MD    Reason for Disposition  . [1] Taking antibiotics > 24 hours AND [2] symptoms WORSE    Protocols used: INFECTION ON ANTIBIOTIC FOLLOW-UP CALL-ADULT - AH

## 2019-12-26 NOTE — Telephone Encounter (Signed)
Received a page from [place], Therapist, sports at the community care line on behalf of Jonah Muellner who reported pain with urination and concern for urinary retention.     Chart review indicates that Johanthan has a history of IBS and had a recent Urgent Care visit for increased urinary frequency and was treated empirically for infection with azithromycin 1g PO. Urinalysis, urine culture, and GC/CT were negative.     Contacted Dreshaun by telephone who reported that since taking the oral antibiotic, he had been having persistent irritation to the tip of his penis without discharge or lesions. He is also experiencing some lower abdominal discomfort and now is concerned that he is having some urinary hesitancy and possibly retention. He is still passing urine, but notes a hard time getting his stream started. He denied any fevers, chills, nausea, vomiting, or stool changes. He is not having any pain with ejaculation or bowel movements.     Kendrich was advised that he may need additional or repeat testing and was notified that I will contact his clinic on his behalf to have him be seen in the next few days. He was encouraged to continue taking Tylenol and ibuprofen for pain relief and to stay well hydrated. He was informed that azithromycin typically does not have a side effect profile of urinary retention and he denied using any other medications, vitamins, or supplements. Precautions were provided for reasons to visit the ED should his symptoms worsen.     A message was sent to his PCP and to the front desk staff at [facility] to schedule follow-up, preferably tomorrow.     Lamar Blinks, MD  PGY2  [place] Residency  [facility]

## 2019-12-27 ENCOUNTER — Ambulatory Visit (INDEPENDENT_AMBULATORY_CARE_PROVIDER_SITE_OTHER): Payer: No Typology Code available for payment source | Admitting: Family Medicine

## 2019-12-27 ENCOUNTER — Telehealth (INDEPENDENT_AMBULATORY_CARE_PROVIDER_SITE_OTHER): Payer: Self-pay | Admitting: Family Medicine

## 2019-12-27 ENCOUNTER — Encounter (INDEPENDENT_AMBULATORY_CARE_PROVIDER_SITE_OTHER): Payer: Self-pay | Admitting: Family Medicine

## 2019-12-27 VITALS — BP 136/83 | HR 88 | Temp 99.3°F | Resp 14 | Wt 135.0 lb

## 2019-12-27 DIAGNOSIS — N451 Epididymitis: Secondary | ICD-10-CM

## 2019-12-27 DIAGNOSIS — R3129 Other microscopic hematuria: Secondary | ICD-10-CM

## 2019-12-27 NOTE — Telephone Encounter (Signed)
RETURN CALL: Voicemail - Detailed Message      SUBJECT:  General Message     MESSAGE: Patient needs to be seen for a follow up for medication reaction per nurse advice to be seen today but patient is reporting of headaches which wont allow to be scheduled for in person please call patient.Thank You

## 2019-12-27 NOTE — Telephone Encounter (Signed)
Routing to PCP to please advise on scheduling request. Can he be seen via telemedicine?    See two 3/10 TEs for details.

## 2019-12-27 NOTE — Progress Notes (Signed)
FAMILY MEDICINE OUTPATIENT VISIT    Type of Visit: established Problem Visit  Primary Care Provider:  Lelan Pons Harle Battiest, MD    Jose Richards is a 34 year old male with history of recent diagnosis of nongonococcal urethritis, who presents to discuss the following:    [place]  Chief Complaint   Patient presents with   . Follow Up Visit     NGU, Left testical swelling, urinary retention      SUBJECTIVE (HPI including relevant ROS)  Was diagnosed with NGU clinically, though his UA was pretty normal except trace occult blood, his urine culture has no growth and his GC/CT was negative. He took azithromycin one dose. Since then, his feeling of burning and frequency has lessened. However, since then he called in complaining about urinary retention. I asked him to describe this more. He says that he had urinary hesitancy where the stream was difficult to start and also there was less pressure in his stream than usual. He was able to press on his bladder and get more urine out. Since that happened, his stream is actually back to normal. But now, he noticed a swelling on the left side of his testicle. He thinks this is new. It is not painful. He does also have history of kidney stones    No fevers or chills.     OBJECTIVE  Vitals:    12/27/19 1755   BP: 136/83   BP Cuff Size: Regular   BP Site: Right Arm   BP Position: Sitting   Pulse: 88   Resp: 14   Temp: 99.3 F (37.4 C)   TempSrc: Temporal   SpO2: 100%   Weight: 135 lb (61.2 kg)     Physical Exam  Vitals signs reviewed.   Constitutional:       Appearance: Normal appearance.   Genitourinary:     Comments: Patient's penis is normal. Right testicle is normal. Left testicle has soft bulge around area of epididymis.   Neurological:      Mental Status: He is alert.   Psychiatric:      Comments: Anxious         ASSESSMENT AND PLAN  [name] is a 34 year old male with the below diagnoses.    1. Microscopic hematuria  2. Epididymitis  Patient's best  diagnosis today is epididymitis. Will repeat UA with micro. Offered patient second course of abx since if he does have an infection it is not chlamydia, but he would like to see whether he gets better. We also discussed ensuring resolution of testicular mass, otherwise could be varicocele, and we could evaluate that with ultrasound. Additionally he will let us know if he has recurrence of urinary retention. He does have a history of kidney stones. Will also repeat UA to evaluate micro for blood.   - Urinalysis Complete      Osker Mason, MD  [facility] MEDICINE [facility]  [place] [address]  [phone]    ----------------------------------------------------------  Patient's Medications   New Prescriptions    No medications on file   Previous Medications    AZITHROMYCIN 250 MG TABLET    Take 4 tablets (1,000 mg) by mouth daily.    CHOLECALCIFEROL (VITAMIN D-3) 125 MCG (5000 UT) TABLET    Take 1,000 Units by mouth daily.    DOCOSAHEXAENOIC ACID (DHA OMEGA 3 OR)    Take by mouth.    IBUPROFEN 200 MG TABLET  Take 400 mg by mouth every 8 hours as needed.    MAGNESIUM OR    Take by mouth.   Modified Medications    No medications on file   Discontinued Medications    No medications on file

## 2019-12-27 NOTE — Telephone Encounter (Signed)
Patient is scheduled with PCP    Closing TE

## 2019-12-28 ENCOUNTER — Encounter (INDEPENDENT_AMBULATORY_CARE_PROVIDER_SITE_OTHER): Payer: Self-pay | Admitting: Family Medicine

## 2019-12-28 LAB — URINALYSIS COMPLETE, URN
Bacteria, URN: NONE SEEN
Bilirubin (Qual), URN: NEGATIVE
Epith Cells_Renal/Trans,URN: NEGATIVE /HPF
Epith Cells_Squamous, URN: NEGATIVE /LPF
Glucose Qual, URN: NEGATIVE mg/dL
Leukocyte Esterase, URN: NEGATIVE
Nitrite, URN: NEGATIVE
Specific Gravity, URN: 1.028 g/mL — ABNORMAL HIGH (ref 1.006–1.027)
WBC, URN: NEGATIVE /HPF
pH, URN: 6 (ref 5.0–8.0)

## 2019-12-28 NOTE — Telephone Encounter (Signed)
Generic response sent.    Routing to provider to please advise. -Thanks!

## 2019-12-28 NOTE — Progress Notes (Signed)
See ecare comment attached to result in the result tab.

## 2019-12-28 NOTE — Telephone Encounter (Signed)
RETURN CALL: Voicemail - Detailed Message      SUBJECT:  Results     MESSAGE: Patient would like to speak with someone as soon as possible that can read and explain to him his most recent test results and also answer some of his questions. CCR attempted to warm transfer, but care team are assisting other patients.    Please assist.       Thank you.

## 2019-12-30 ENCOUNTER — Ambulatory Visit (HOSPITAL_BASED_OUTPATIENT_CLINIC_OR_DEPARTMENT_OTHER): Payer: Self-pay

## 2020-01-03 ENCOUNTER — Encounter (INDEPENDENT_AMBULATORY_CARE_PROVIDER_SITE_OTHER): Payer: Self-pay | Admitting: Family Medicine

## 2020-01-04 NOTE — Telephone Encounter (Signed)
Generic response sent.    Routing to provider to please advise on scheduling question. -Thanks!

## 2020-01-07 ENCOUNTER — Ambulatory Visit (INDEPENDENT_AMBULATORY_CARE_PROVIDER_SITE_OTHER): Payer: No Typology Code available for payment source | Admitting: Urology

## 2020-01-07 ENCOUNTER — Other Ambulatory Visit: Payer: Self-pay

## 2020-01-07 ENCOUNTER — Encounter (INDEPENDENT_AMBULATORY_CARE_PROVIDER_SITE_OTHER): Payer: Self-pay | Admitting: Family Medicine

## 2020-01-07 ENCOUNTER — Encounter (INDEPENDENT_AMBULATORY_CARE_PROVIDER_SITE_OTHER): Payer: Self-pay | Admitting: Urology

## 2020-01-07 ENCOUNTER — Ambulatory Visit (INDEPENDENT_AMBULATORY_CARE_PROVIDER_SITE_OTHER): Payer: No Typology Code available for payment source | Admitting: Family Medicine

## 2020-01-07 VITALS — BP 133/89 | HR 86 | Temp 99.2°F | Resp 16 | Ht 66.5 in | Wt 133.0 lb

## 2020-01-07 VITALS — HR 94 | Temp 98.0°F | Wt 135.0 lb

## 2020-01-07 DIAGNOSIS — Z0000 Encounter for general adult medical examination without abnormal findings: Secondary | ICD-10-CM

## 2020-01-07 DIAGNOSIS — R634 Abnormal weight loss: Secondary | ICD-10-CM

## 2020-01-07 DIAGNOSIS — R3129 Other microscopic hematuria: Secondary | ICD-10-CM

## 2020-01-07 DIAGNOSIS — R590 Localized enlarged lymph nodes: Secondary | ICD-10-CM

## 2020-01-07 DIAGNOSIS — R300 Dysuria: Secondary | ICD-10-CM

## 2020-01-07 DIAGNOSIS — Z1159 Encounter for screening for other viral diseases: Secondary | ICD-10-CM

## 2020-01-07 DIAGNOSIS — R350 Frequency of micturition: Secondary | ICD-10-CM

## 2020-01-07 DIAGNOSIS — R3912 Poor urinary stream: Secondary | ICD-10-CM

## 2020-01-07 DIAGNOSIS — R39198 Other difficulties with micturition: Secondary | ICD-10-CM

## 2020-01-07 LAB — PR U/A NONAUTO W/MICRO, ONSITE
Bilirubin (Indirect): NEGATIVE
Glucose, Urine: NEGATIVE mg/dL
Leukocytes: NEGATIVE
Nitrite, URN: NEGATIVE
Occult Blood, URN: NEGATIVE
Specific Gravity, URN: 1.025 (ref 1.005–1.030)
Urobilinogen, URN: 0.2 E.U./dL (ref 0.2–1.0)
pH, URN (UWNC): 5 (ref 5.0–8.0)

## 2020-01-07 LAB — PR MEAS POST-VOIDING RESIDUAL URINE&/BLADDER CAP

## 2020-01-07 LAB — PR U/A AUTO W/MICRO, ONSITE
Bacteria: NEGATIVE /HPF
Bilirubin, Urine: NEGATIVE
Casts, URN: NEGATIVE /LPF
Crystals, URN: NEGATIVE /LPF
Epithelial Cells, URN: NEGATIVE /LPF
Glucose, Urine: NEGATIVE mg/dL
Ketones, URN: NEGATIVE mg/dL
Leukocytes: NEGATIVE
Nitrite, URN: NEGATIVE
Protein: NEGATIVE mg/dL
RBC, Urine: NEGATIVE
Specific Gravity, Urine: 1.015 (ref 1.005–1.030)
Urobilinogen, URN: 0.2 E.U./dL (ref 0.2–1.0)
WBC, URN: NEGATIVE /HPF (ref ?–5)
pH, URN: 6 (ref 5.0–8.0)

## 2020-01-07 NOTE — Progress Notes (Signed)
Patient Rooming (in-clinic or Telemed): in clinic     Reason for Visit: see cc     Refills? NO  Referral? NO  Letter or Form? NO  Lab Results? NO    HEALTH MAINTENANCE:  Has the patient had this done since their last visit?  Cervical screening/PAP: N/A  Mammo: N/A  Colon Screen: N/A    Have you seen a specialist since your last visit: No    Vaccines Due? Yes,     HM Due:   Health Maintenance   Topic Date Due   . Hepatitis C Screening  Never done   . Influenza Vaccine (1) 07/19/2019   . Depression Monitoring (PHQ-9)  01/18/2020   . DTaP, Tdap, and Td Vaccines (2 - Td) 08/23/2028   . HIV Screening  Completed   . Hepatitis A Vaccine  Aged Out   . Hepatitis B Vaccine  Aged Out   . Pneumococcal Vaccine: Pediatrics (0-5 years) and At-Risk Patients (6-64 years)  Aged Out       PCP Verified?  Yes, Dr.Sansted

## 2020-01-07 NOTE — Progress Notes (Signed)
UROLOGY NEW PATIENT VISIT    ID/CC:    Chief Complaint   Patient presents with   . Urine Problem       History of Present Illness:   Jose Richards is a 34 year old male who presents today.  He is referred with about 2 weeks of urinary frequency and weak stream.  It is uncomfortable before urinating but no dysuria per se.  Urinalyses have shown dipstick positive occult blood but no more than 2-3 RBCs per high-power field.  He was treated with a Z-Pak.  This did not help the situation.  Several years ago he was diagnosed as possibly having a kidney stone but at the same time he was diagnosed with epididymitis.  That resolved itself.  He has not had any injuries.    Review of Systems:   A 14 system review of system was filled out by the patient.  Positives include irritable bowel syndrome but his symptoms are really only bloating, no cramps or diarrhea.  The rest of the review of system is negative and can be linked to in the chart dated today.        Past Medical Hx:    Past Medical History:   Diagnosis Date   . Migraines      Patient Active Problem List   Diagnosis   . Mild episode of recurrent major depressive disorder ([place])   . Vitamin D deficiency   . H/O encephalitis- at age 42   . Migraine with aura and without status migrainosus, not intractable   . H/O concussion   . Anxiety       Past Surgical Hx:    Past Surgical History:   Procedure Laterality Date   . right shoulder labrum repair         Family and Social Hx:    Mr. Mabra , family history includes Cancer in his maternal grandfather and paternal grandfather; Depression in his brother; Hypertension in his father and mother; Kidney Disorder in his paternal grandfather; Lipids/Cholesterol in his mother; Mental Illness in his brother; Skin Cancer in his maternal grandfather and paternal grandfather. There is no history of Diabetes, Stroke, or Heart Disease.[name] He  reports that he quit smoking about 5 years ago. He smoked 0.00 packs per day. He has  never used smokeless tobacco. He reports current alcohol use. He reports current drug use. Drug: Marijuana..  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  Grew up in [place], [place].  Living in [place] for 2 years.  Lives with his girlfriend.      Diet:  3 meals per day plus 1-2 snacks.  Well balanced, lean protein, whole grains, veggies, 1-2 pieces of fruit per day.     Exercise:  20 minute HIIT routine 3-4 times per week plus 3 mile runs on the weekends.          2021: male partner, pill        Looking for a job       Active Meds:    No outpatient medications have been marked as taking for the 01/07/20 encounter (Office Visit) with [name], MD.       Allergies:    Allergies as of 01/07/2020   . (No Known Allergies)       OBJECTIVE:  Physical Exam:  Pulse 94   Temp 98 F (36.7 C) (Temporal)   Wt  135 lb (61.2 kg)   SpO2 98%   BMI 21.46 kg/m   Constitutional: ECOG normal  HEENT:  wnl  Lymphatic:  no adenopathy.  Resp:  normal effort  CV:  normal pulses, no peripheral edema  Abdomen:  no abdominal masses, no CVA tenderness and no hernias noted.  GU Exam:       circumcised penis. normal meatus with normal calibur     Testes descended bilaterally, Epidimymis normal to palpation,     Perineum normal to visual inspection. Sphincter with normal tone,  Prostate nontender approximately 10-15 grams in size with no nodularity  Extremities:  no cyanosis.  no edema.  Neuro/Psych: affect normal .    Labs Reviewed today with the patient include:  Office Visit on 01/07/2020   Component Date Value Ref Range Status   . Color, Urine 01/07/2020 Yellow   Final   . Clarity, URN 01/07/2020 Clear   Final   . Glucose, Urine 01/07/2020 Negative  NEG mg/dL Final   . Bilirubin, Urine 01/07/2020 Negative  NEG Final   . Ketones, URN 01/07/2020 Negative  NEG mg/dL Final   . Specific Gravity, Urine 01/07/2020 1.015  1.005 - 1.030 Final   . Occult Blood, URN 01/07/2020  Trace* NEG Final   . pH, URN 01/07/2020 6.0  5.0 - 8.0 Final   . Protein 01/07/2020 Negative  NEG-TRACE mg/dL Final   . Urobilinogen, URN 01/07/2020 0.2  0.2 - 1.0 E.U./dL Final   . Nitrite, URN 01/07/2020 Negative  NEG Final   . Leukocytes 01/07/2020 Negative  NEG Final   . RBC, Urine 01/07/2020 Negative  NEG (0-2) Final   . WBC, URN 01/07/2020 Negative  NEG(0-5) /HPF Final   . Epithelial Cells, URN 01/07/2020 Negative  /LPF Final   . Bacteria 01/07/2020 Negative  /HPF Final   . Casts, URN 01/07/2020 Negative  /LPF Final   . Crystals, URN 01/07/2020 Negative  /LPF Final         ASSESSMENT/PLAN:   Jose Richards is a 34 year old male with weak stream and urinary frequency.  He has some trace blood.  He was told in the past that he had gravel in his kidneys but not all have any report of that.  I would like to get a CT/KUB to rule out kidney stones as a cause for his current discomfort because this could easily be a small stone in his distal ureter.  His bladder scan showed that he empties completely.  His urinalysis after prostate massage did not show any evidence of prostatitis.  We will contact him with the results of the CT scan and I told him we would consider an antibiotic if the CT scan is negative and he is still symptomatic.

## 2020-01-07 NOTE — Progress Notes (Signed)
SUBJECTIVE:    Jose Richards is a 34 year old male here for a wellness/preventive health care visit.  Additional concerns:     Urinary symptoms are on and off. Burning, feels like he just peed.  Testicular pain is better. Sometimes low pressure, sometimes trickling.    Eating less with invisalign, thinks this explains weight loss. Also stopped lifting weights.     Stopped using marijuana with no improvement.     IBS: 3 BMs per day, more solid recently. Has loose stools sometimes. In the past he had some blood in his stool but had a little fissure when he got it investigated.     1. Little interest or pleasure in doing things: 1    2. Feeling down, depressed or hopeless: 1    3. Trouble falling or staying asleep, or sleeping too much: (!) 2    4. Feeling tired or having little energy : (!) 2    5. Poor appetite or overeating: 0    6. Feeling bad about yourself - or that you are a failure or have let yourself or your family down: 1    7. Trouble concentrating on things, such as reading the newspaper or watching television: (!) 2    8. Moving or speaking much more slowly than usual.  Or the opposite - fidgety or restless: 1    9. Thoughts that you would be better off dead or of hurting yourself in some way: 0    PHQ9 Total Score: 10    10. If you checked off any problems, how difficult have these problems made it for you to do your work, take care of things at home, or get along with other people?: Somewhat difficult    Current dietary habits: healthy diet in general, but eating less lately because of symptoms  Current exercise habits: yes, engages in regular exercise, but less lately because of symptoms  Regular seat belt use: YES  Guns in the house: No  History sections of chart reviewed and updated today: yes    Patient Active Problem List    Diagnosis Date Noted   . Anxiety [F41.9] 08/24/2018   . Mild episode of recurrent major depressive disorder ([place]) [F33.0] 08/23/2018   . Vitamin D deficiency  [E55.9] 08/23/2018   . H/O encephalitis- at age 72 [Z86.61] 08/23/2018   . Migraine with aura and without status migrainosus, not intractable [G43.109] 08/23/2018   . H/O concussion [Z87.820] 08/23/2018       Current Outpatient Medications   Medication Sig Dispense Refill   . Cholecalciferol (Vitamin D-3) 125 MCG (5000 UT) tablet Take 1,000 Units by mouth daily.     . Docosahexaenoic Acid (DHA OMEGA 3 OR) Take by mouth.     [name] ibuprofen 200 MG tablet Take 400 mg by mouth every 8 hours as needed.     [name] MAGNESIUM OR Take by mouth.       No current facility-administered medications for this visit.       Review of Systems   Constitutional: Positive for diaphoresis (night sweats) and unexpected weight change. Negative for chills, fatigue and fever.   HENT: Negative for ear discharge, ear pain and hearing loss.    Respiratory: Negative for cough and shortness of breath.    Cardiovascular: Negative for chest pain.   Gastrointestinal: Positive for abdominal pain (LLQ) and diarrhea (Has dx of IBS and gets "flares", had one last week). Negative for anal bleeding (Not now, but in past  has had small fissure with small amount of bleeding that resolved), constipation, nausea and vomiting.   Endocrine: Positive for heat intolerance. Negative for polyuria.   Genitourinary: Positive for dysuria.   Skin: Negative for rash.   Hematological: Positive for adenopathy (groin).   Psychiatric/Behavioral: The patient is nervous/anxious.        SEXUAL HISTORY  Sexual activity: yes, single partner, contraception - oral contraceptives (combined)  Number of sex partners in the past year: 1  Sexual concerns: No    PHYSICAL EXAM:  BP 133/89   Pulse 86   Temp 99.2 F (37.3 C) (Temporal)   Resp 16   Ht 5' 6.5" (1.689 m)   Wt 133 lb (60.3 kg)   SpO2 97%   BMI 21.15 kg/m    General: no acute distress, appears healthy, comfortable, alert, cooperative  Skin: Skin color, texture, turgor normal. No rashes or concerning lesions  Head:  Normocephalic. No masses, lesions, tenderness or abnormalities  Eyes: Lids/periorbital skin normal, Conjunctivae/corneas clear, PERRL, EOM's intact  Ears: External ears normal. Canals clear. Right TM normal. Let TM with small perforation. Patient reports history of tympanostomy tubes as a child.   Nose: normal  Oropharynx: normal  Teeth: normal dentition for age  Neck: supple. No adenopathy. Thyroid symmetric, normal size, without nodules  Lungs: clear to auscultation  Heart: normal rate, regular rhythm and no murmurs, clicks, or gallops  Abd: soft, non-tender. BS normal. No masses or organomegaly. No inguinal hernia on palpation.  GU: deferred  Lymph: Patient has several small, shotty, mobile, rubbery lymph nodes bilaterally in inguinal region. No axillary lymphadenopathy.  Rectal: deferred  Prostate: deferred  Spine: Back symmetric, no deformity; ROM normal; No CVA tenderness.  Ext: Normal, without deformities, edema, or skin discoloration, radial and DP pulses 2+ bilaterally  Neuro:  Grossly normal to observation, gait normal  -----------------------------------------    ASSESSMENT AND PLAN:  Health Maintenance   Topic Date Due   . Hepatitis C Screening  Never done   . Influenza Vaccine (1) 07/19/2019   . Depression Monitoring (PHQ-9)  01/18/2020   . DTaP, Tdap, and Td Vaccines (2 - Td) 08/23/2028   . HIV Screening  Completed   . Hepatitis A Vaccine  Aged Out   . Hepatitis B Vaccine  Aged Out   . Pneumococcal Vaccine: Pediatrics (0-5 years) and At-Risk Patients (6-64 years)  Aged Out     1. Routine general medical examination at a health care facility    2. Encounter for hepatitis C screening test for low risk patient  - Hepatitis C Ab with Reflex PCR     Immunizations or studies due: hep C  HIV screening offered: negative on 11/19  HPV vaccine offered: N/A  Preventive counseling: importance of regular dental care, healthy dietary guidelines, proper exercise, wearing of seat belts, safe storage of firearms    3.  Dysuria  4. Decreased urine stream  Patient having persistent symptoms. It does seem related to anxiety, so perhaps we will not find anything and it will become clear that this is a somatic symptom. However, that is a diagnosis of exclusion. Will repeat urine tests today and refer to urology.  - U/A AUTO W/MICRO, ONSITE  - REFERRAL TO UROLOGY  - Urine Culture    5. Weight loss, abnormal  6. Inguinal lymphadenopathy  Patient with unintentional weight loss, but he feels like it is explained by his eating and exercise changes. Also with night sweats, lymphadenopathy, family hx of colon  cancer. Lymph nodes are quite small and likely benign. Will start with labs, but if sx continue I think colonoscopy would be a reasonable next step.   - TSH with Reflexive Free T4  - Basic Metabolic Panel  - CBC

## 2020-01-08 LAB — BASIC METABOLIC PANEL
Anion Gap: 9 (ref 4–12)
Calcium: 10 mg/dL (ref 8.9–10.2)
Carbon Dioxide, Total: 30 meq/L (ref 22–32)
Chloride: 101 meq/L (ref 98–108)
Creatinine: 0.61 mg/dL (ref 0.51–1.18)
Glucose: 91 mg/dL (ref 62–125)
Potassium: 4.4 meq/L (ref 3.6–5.2)
Sodium: 140 meq/L (ref 135–145)
Urea Nitrogen: 11 mg/dL (ref 8–21)
eGFR by CKD-EPI: >60 mL/min/{1.73_m2} (ref >59)

## 2020-01-08 LAB — HEPATITIS C AB WITH REFLEX PCR: Hepatitis C Antibody w/Rflx PCR: NONREACTIVE

## 2020-01-08 LAB — CBC (HEMOGRAM)
Hematocrit: 44 % (ref 38–50)
Hemoglobin: 15 g/dL (ref 13.0–18.0)
MCH: 27.5 pg (ref 27.3–33.6)
MCHC: 34 g/dL (ref 32.2–36.5)
MCV: 81 fL (ref 81–98)
Platelet Count: 255 10*3/uL (ref 150–400)
RBC: 5.45 10*6/uL (ref 4.40–5.60)
RDW-CV: 12.2 % (ref 11.6–14.4)
WBC: 7.32 10*3/uL (ref 4.3–10.0)

## 2020-01-08 LAB — TSH WITH REFLEXIVE FREE T4: TSH with Reflexive Free T4: 2.356 u[IU]/mL (ref 0.400–5.000)

## 2020-01-08 NOTE — Progress Notes (Signed)
See ecare comment attached to result in the result tab.

## 2020-01-09 LAB — URINE C/S: Culture: NO GROWTH

## 2020-01-10 NOTE — Progress Notes (Signed)
See ecare comment attached to result in the result tab.

## 2020-01-14 ENCOUNTER — Encounter (HOSPITAL_BASED_OUTPATIENT_CLINIC_OR_DEPARTMENT_OTHER): Payer: Self-pay

## 2020-01-17 ENCOUNTER — Ambulatory Visit
Admission: RE | Admit: 2020-01-17 | Discharge: 2020-01-17 | Disposition: A | Discharge status: Home / Self Care | Payer: No Typology Code available for payment source | Attending: Diagnostic Radiology | Admitting: Diagnostic Radiology

## 2020-01-17 ENCOUNTER — Telehealth (INDEPENDENT_AMBULATORY_CARE_PROVIDER_SITE_OTHER): Payer: Self-pay | Admitting: Urology

## 2020-01-17 ENCOUNTER — Other Ambulatory Visit: Payer: Self-pay

## 2020-01-17 DIAGNOSIS — R350 Frequency of micturition: Secondary | ICD-10-CM | POA: Insufficient documentation

## 2020-01-17 DIAGNOSIS — R3129 Other microscopic hematuria: Secondary | ICD-10-CM

## 2020-01-17 NOTE — Telephone Encounter (Signed)
Called and spoke to patient and relayed notes below.  Patient verbalized understanding and had no further questions at this time.

## 2020-01-17 NOTE — Telephone Encounter (Signed)
-----   Message from [name], MD sent at 01/17/2020  4:21 PM PDT -----  Please call the patient regarding his abnormal result.  No significant stones.

## 2020-01-25 ENCOUNTER — Encounter (HOSPITAL_COMMUNITY): Payer: Self-pay

## 2020-02-29 ENCOUNTER — Other Ambulatory Visit (HOSPITAL_BASED_OUTPATIENT_CLINIC_OR_DEPARTMENT_OTHER): Payer: Self-pay | Admitting: Dermatology

## 2020-04-02 ENCOUNTER — Telehealth (INDEPENDENT_AMBULATORY_CARE_PROVIDER_SITE_OTHER): Payer: No Typology Code available for payment source | Admitting: Family Medicine

## 2020-04-02 DIAGNOSIS — R252 Cramp and spasm: Secondary | ICD-10-CM

## 2020-04-02 NOTE — Progress Notes (Signed)
TELEMEDICINE VISIT      SUBJECTIVE:    34 yo checking in about bad back cramp he got 4 days ago.  Began in lower mid back Does yoga usually.  Was just standing, felt tightness, progressed to the point where he couldn't bend. Called nurse line.     Cramp lasted about 90 min.   Followed nurse's insturctions, ic X 2 days, then used heat.  Took BID 600 mg ibuprofen and 500 mg acetaminophen.     Feels much better, L > R tighter.   Doing some gentle stretches, hasn't been doing yoga.    Does moderate intensity biking indoors which is on a bike without optimal position, plans getting better bike.  Changed his sleeping pattern from front to back.     Was supposed to fly home 3 days from home, to [facility] (from [place]) .worried about coach seat that he couldn't choose  Upgraded so he can have a first class seat.      Patient Active Problem List   Diagnosis    Mild episode of recurrent major depressive disorder ([place])    Vitamin D deficiency    H/O encephalitis- at age 20    Migraine with aura and without status migrainosus, not intractable    H/O concussion    Anxiety         OBJECTIVE:  Pleasant, talkative,engaged,d constantly moving  Lean, muscled.  Moves without difficulty.  Forward flexes well w/o apparent discomfort.    ASSESSMENT/PLAN:    Back pain due to muscular cramping, uncertain trigger, but doing well now.    Counseled re: upcoming trip via plane, reinforced his current treatment.      Distant Site Telemedicine Encounter    I conducted this encounter from My Home via secure, live, face-to-face video conference with the patient. Jose Richards was located at home with  n/a. The patient had had a previous telemed visit with me and consent was implied by him making the appt and connecting via videoconferencing.This visit took 25 minutes including chart review and charting.

## 2020-05-12 ENCOUNTER — Ambulatory Visit: Payer: No Typology Code available for payment source | Attending: Dermatology | Admitting: Unknown Physician Specialty

## 2020-05-12 ENCOUNTER — Encounter (HOSPITAL_BASED_OUTPATIENT_CLINIC_OR_DEPARTMENT_OTHER): Payer: Self-pay | Admitting: Unknown Physician Specialty

## 2020-05-12 DIAGNOSIS — L700 Acne vulgaris: Secondary | ICD-10-CM | POA: Insufficient documentation

## 2020-05-12 DIAGNOSIS — D239 Other benign neoplasm of skin, unspecified: Secondary | ICD-10-CM | POA: Insufficient documentation

## 2020-05-12 DIAGNOSIS — Z1283 Encounter for screening for malignant neoplasm of skin: Secondary | ICD-10-CM | POA: Insufficient documentation

## 2020-05-12 DIAGNOSIS — D229 Melanocytic nevi, unspecified: Secondary | ICD-10-CM

## 2020-05-12 NOTE — Progress Notes (Signed)
Chief Complaint growth on back, FSE      Do you have a history of skin cancer? NO    Personal history of melanoma? NO    Immediate family history of melanoma? NO    Would you like a full skin exam today? YES    Gown: YES      If our office needs to contact you after your visit today, is it ok to leave a detailed message on your phone or e-care ?  YES      What is the preferred method of contact e-care/ phone  number? [phone]

## 2020-05-12 NOTE — Patient Instructions (Signed)
It was a pleasure to see you in clinic today. Here are the important take-home points from today's visit:    1. No skin lesions concerning for skin cancer or melanoma today. Continue to use SPF 30+ sunscreen when out-doors. Apply every 2 hours.  2. Try benzoyl peroxide wash in the shower every morning for the acne on your back    I will see you back in clinic as needed for new/changing moles.     Please feel free to get in touch if you have any questions or concerns.    Thanks!

## 2020-05-12 NOTE — Progress Notes (Signed)
[facility] [place]  [address]  [place]  [address]  TEL: [phone]  l  FAX: [phone]      05/12/2020    PRIMARY CARE PROVIDER:  Su Hoff, [place] [address]    REFERRING PROVIDER:  Melynda Keller, PA-C    PATIENT: Jose Richards    [id number]    IDENTIFICATION/CHIEF CONCERN:    Jose Richards is a 34 year old new patient w/ pmh of anxiety/depression and Vitamin D deficiency seen today for initial consult for FBSE in the setting of FHx of NMSC.     I reviewed the consulting provider's note at time referral was placed.    DERMATOLOGY HISTORY AND TREATMENTS:  No recorded past dermatologic history    HISTORY OF PRESENT ILLNESS:  Jose Richards was seen by his PCP at the end of 2019. At that visit a referral to Dermatology was placed for a full skin exam given family history of BCC.  Unfortunately, due to the COVID-19 pandemic, his appointment had to be canceled and rescheduled.  Today he presents for full body skin exam.    In the interim, Jose Richards reports 1 lesion of concern which he believes to be a mole over the back that has grown in size recently. He also thinks it is a little "squishy." He also reports numerous acne lesions over the back and chest.      Denies fever, chills, new rash.     PAST MEDICAL HISTORY:  -Chronic migraines  -Anxiety/depression  -IBD  -Vitamin D deficiency    SOCIAL HISTORY:  Alcohol: about 12-18 drinks per week.  Tobacco: denies current use  Illicit: denies  Sun exposure: lived at [employer] as a child. Lots of sunburns in the past, none of which were blistering. He uses SPF 45 sunscreen when outdoors.     FAMILY HISTORY:  Paternal Grandfather: Melanoma  Great Uncle: melanoma  Mother's side: ?BCC history     PHYSICAL EXAMINATION:  A full skin examination was performed sparing the buttocks was performed and was significant for:  -Scattered brown macules with a regular pigment  network over the upper extremities, back and plantar surface of the right foot (with parallel furrow pattern) , c/w melanocytic nevi   -Mid back: skin-colored to slightly pink papule with remnants of a reticular pigment network, c/w intradermal nevus  -Upper back: flat light brown 8 mm macule with reticular pigment network, c/w melanocytic nevus  -Scattered red papules over the back, c/w acne     Previous reports/labs reviewed:   -Normal UA, CBC, BMP, TSH in 12/2019  -Hep C negative    IMPRESSION/PLAN:  The following was discussed with the patient.  Recommendations are as follows:    1. Skin cancer screening  No lesions of concern for malignancy identified on full skin exam today.  The patient was counseled on the ABCDEs of melanoma and the importance of hats/skin covering as well as SPF 30 and above sunscreen. Recommend self skin checks every 1-2 months.    2. Multiple melanocytic nevi  Clinically, all with very typical features, regular pigment networks.  We will continue to monitor.  Signs of melanoma discussed with the patient as above.    3. Acne vulgaris  Mostly involving the upper back.  The patient is reluctant to start prescribe topical or oral medication.  He will try benzyl  peroxide wash every morning.    4. Intradermal nevus  Benign.  Patient reassurance provided.    Return to clinic as needed.    I saw this patient with Volanda Napoleon, MD    Signed: Gardiner Sleeper, MD

## 2020-05-15 NOTE — Progress Notes (Signed)
-------------------------------------------    Attending: Darrien Laakso, MD  I personally saw and evaluated the patient. I discussed this patient's history and physical findings with the resident, as well as the plan for this patient.  I agree with the resident's note.  -------------------------------------------

## 2020-05-27 ENCOUNTER — Encounter (HOSPITAL_BASED_OUTPATIENT_CLINIC_OR_DEPARTMENT_OTHER): Payer: Self-pay

## 2020-05-27 DIAGNOSIS — R109 Unspecified abdominal pain: Secondary | ICD-10-CM

## 2020-06-18 NOTE — Progress Notes (Signed)
No chief complaint on file.    Distant Site Telemedicine Encounter    I conducted this encounter from My Home via secure, live, face-to-face video conference with the patient. Jose Richards was located at home in [place] with privacy.  I reviewed the risks and benefits of telemedicine as pertinent to this visit and the patient agreed to proceed.       Subjective:     Jose Richards is a 34 year old male who presents on 06/19/2020.    Agenda: wrist pain    HPI:    Character: dull ache pain  Onset: out of the blue, no falls, no injuries, woke up with it  Location: bilateral wrists between thumb and forefinger, down along medial wrist  Duration: going on for 3 weeks - 2 weeks  Remitting factors: better with resting, tried ice, no heat packs, tried a bit of tylenol or ibuprofen, but not now, no voltaren gel, tried brace for L wrist only;   Exacerbating factors: worse with bending wrist up, push ups  Associated sx: no numbness or tingling, no weakness, no dropping things  Radiation: none  Sick contacts: n/a  Intensity: 1-2 out of 10  Timing: constant, worsening and getting better depending on activity  Significance: hard to do yoga because of the pain; overall getting better  -started playing video games, woke up with   -no hx of falls or fractures/surgeries  -maybe a bit relief from shaking out hand  - no clicking, locking, popping       Objective:    Vitals: There were no vitals taken for this visit.  Physical Exam  Vitals signs reviewed.   Constitutional:       Appearance: He is well-developed.   HENT:      Head: Normocephalic and atraumatic.      Nose: No congestion or rhinorrhea.   Eyes:      General: No scleral icterus.     Conjunctiva/sclera: Conjunctivae normal.   Neck:      Trachea: No tracheal deviation.   Pulmonary:      Effort: Pulmonary effort is normal.   Musculoskeletal: Normal range of motion.      Right wrist: He exhibits normal range of motion, no tenderness, no bony tenderness, no swelling, no effusion,  no crepitus, no deformity and no laceration.      Left wrist: He exhibits normal range of motion, no tenderness, no bony tenderness, no swelling, no effusion, no crepitus, no deformity and no laceration.      Comments: Mildly positive Finkelstein test, otherwise able to make okay sign, scissors, thumbs up, sensation intact, strength 5 out of 5 in bilateral wrist, able to pick up weights, some pain noted with dorsiflexion of bilateral wrists   Skin:     General: Skin is warm and dry.   Neurological:      Mental Status: He is alert and oriented to person, place, and time.   Psychiatric:         Mood and Affect: Mood normal.         Behavior: Behavior normal.            Assessment and Plan:      Tenosynovitis, de Quervain  Patient with history of excessive gaming recently using joysticks, bilateral pain and exam today without traumatic history suggesting fracture, wrist appears neurovascularly intact along with hands, no numbness or tingling to suggest carpal tunnel syndrome, counseled on conservative management, relative rest, follow-up as needed but may take time  to improve.  Close return precautions given

## 2020-06-19 ENCOUNTER — Telehealth (INDEPENDENT_AMBULATORY_CARE_PROVIDER_SITE_OTHER): Payer: No Typology Code available for payment source | Admitting: Family Medicine

## 2020-06-19 DIAGNOSIS — M654 Radial styloid tenosynovitis [de Quervain]: Secondary | ICD-10-CM

## 2020-06-19 NOTE — Patient Instructions (Signed)
Diclofenac gel apply up to 4 times daily & ibuprofen 400mg  every 8 hours as needed and acetaminophen 650mg  every 6 hours as needed    Patient Education     Understanding Education officer, environmental Tenosynovitis    De Quervain tenosynovitis is a condition that can cause wrist and thumb pain. Tendons connect muscles in your wrist and forearm to the bones in your thumb. The tendons have a protective cover (sheath). The sheath's lining makes a fluid that lets the tendons slide easily when you straighten your wrist and thumb. If any of these tendons are irritated or injured, they can become swollen and inflamed. This is called de Quervain tenosynovitis.   How to say it  di-kwer-VAYN ten-oh-sin-oh-VY-tis   What causes de Quervain tenosynovitis?  This condition is most often caused from overuse. For example, making the same wrist motions over and over can irritate the tendons. This includes doing things like unscrewing jar lids or grasping a tool. Activities such as typing, playing racquet sports, knitting, and texting can also lead to the condition.   Symptoms of de Quervain tenosynovitis  You may have pain, soreness, redness, and swelling along the side of your wrist and the base of your thumb. You may feel pain when you pinch or grasp things, turn or touch your wrist, or make a fist. Your thumb may catch or make a crackling sound when you move it.   Treatment for de Quervain tenosynovitis  Treatments may include:   Resting the wrist and thumb. This involves limiting movements that make your symptoms worse. You also may need to avoid certain hobbies, sports, and types of work for a time.   Cold packs. These help reduce pain and swelling.   Prescription or over-the-counter medicines.  These help relieve pain and swelling. NSAIDs (nonsteroidal anti-inflammatory drugs) are the most common medicines used. Medicines may be prescribed or bought over the counter. They may be given as pills. Or they may be put on the skin as a gel, cream,  or patch.   Splint or brace. This helps keep the thumb and wrist from moving and gives time for your tendons to heal.   Exercises or physical therapy. These help stretch, strengthen, and improve the range of motion in your wrist and thumb.   Shots of medicine into the area around the tendon.  These usually are anti-inflammatory medicines called corticosteroids . They may help ease pain and swelling .   Surgery. You may need surgery if other treatments don't relieve symptoms. During surgery, the surgeon releases the sheath that surrounds the tendons so the tendons can move more easily.  Possible complications of de Quervain tenosynovitis  Without proper care and treatment, healing may take longer than normal. Also, symptoms may continue or get worse. Over time, the problem may become long-term (chronic). This can make it hard to use your wrist and thumb for normal activities.   When to call your healthcare provider  Call your healthcare provider right away if you have any of these:   Fever of 100.69F (38C) or higher, or as directed by your provider   Chills   Symptoms that don't get better with treatment, or get worse   Pain, numbness, or coldness in the hand   New symptoms  StayWell last reviewed this educational content on 09/17/2018   2000-2020 [employer]. [address] [place] [address]. All rights reserved. This information is not intended as a substitute for professional medical care. Always follow  your healthcare professional's instructions.           Patient Education     Understanding Jose Richards Tenosynovitis    Jose Richards tenosynovitis is a condition that can cause wrist and thumb pain. Tendons connect muscles in your wrist and forearm to the bones in your thumb. The tendons have a protective cover (sheath). The sheath's lining makes a fluid that lets the tendons slide easily when you straighten your wrist and thumb. If any of these tendons are irritated or injured, they  can become swollen and inflamed. This is called de Quervain tenosynovitis.   How to say it  di-kwer-VAYN ten-oh-sin-oh-VY-tis   What causes de Quervain tenosynovitis?  This condition is most often caused from overuse. For example, making the same wrist motions over and over can irritate the tendons. This includes doing things like unscrewing jar lids or grasping a tool. Activities such as typing, playing racquet sports, knitting, and texting can also lead to the condition.   Symptoms of de Quervain tenosynovitis  You may have pain, soreness, redness, and swelling along the side of your wrist and the base of your thumb. You may feel pain when you pinch or grasp things, turn or touch your wrist, or make a fist. Your thumb may catch or make a crackling sound when you move it.   Treatment for de Quervain tenosynovitis  Treatments may include:   Resting the wrist and thumb. This involves limiting movements that make your symptoms worse. You also may need to avoid certain hobbies, sports, and types of work for a time.   Cold packs. These help reduce pain and swelling.   Prescription or over-the-counter medicines.  These help relieve pain and swelling. NSAIDs (nonsteroidal anti-inflammatory drugs) are the most common medicines used. Medicines may be prescribed or bought over the counter. They may be given as pills. Or they may be put on the skin as a gel, cream, or patch.   Splint or brace. This helps keep the thumb and wrist from moving and gives time for your tendons to heal.   Exercises or physical therapy. These help stretch, strengthen, and improve the range of motion in your wrist and thumb.   Shots of medicine into the area around the tendon.  These usually are anti-inflammatory medicines called corticosteroids . They may help ease pain and swelling .   Surgery. You may need surgery if other treatments don't relieve symptoms. During surgery, the surgeon releases the sheath that surrounds the tendons so the  tendons can move more easily.  Possible complications of de Quervain tenosynovitis  Without proper care and treatment, healing may take longer than normal. Also, symptoms may continue or get worse. Over time, the problem may become long-term (chronic). This can make it hard to use your wrist and thumb for normal activities.   When to call your healthcare provider  Call your healthcare provider right away if you have any of these:   Fever of 100.42F (38C) or higher, or as directed by your provider   Chills   Symptoms that don't get better with treatment, or get worse   Pain, numbness, or coldness in the hand   New symptoms  StayWell last reviewed this educational content on 09/17/2018   2000-2020 [employer]. [address] [place] [address]. All rights reserved. This information is not intended as a substitute for professional medical care. Always follow your healthcare professional's instructions.

## 2020-07-01 ENCOUNTER — Telehealth (INDEPENDENT_AMBULATORY_CARE_PROVIDER_SITE_OTHER): Payer: Self-pay | Admitting: Family Medicine

## 2020-07-01 DIAGNOSIS — Z111 Encounter for screening for respiratory tuberculosis: Secondary | ICD-10-CM

## 2020-07-01 NOTE — Telephone Encounter (Signed)
Routing to covering provider to please advise on request. -Thanks!

## 2020-07-01 NOTE — Telephone Encounter (Signed)
RETURN CALL: MyChart reply      SUBJECT:  Orders     ORDERS FOR: tb test  VERBAL OR FAXED ORDER REQUEST: within [facility]   ADDITIONAL INFORMATION: Pt state he needs the order today because his starting a job on Monday 9/20th and he needs to do a tb test prior. Please send an ecare message to let pt know it's been ordered.Thank you.

## 2020-07-30 NOTE — Progress Notes (Signed)
No chief complaint on file.    Distant Site Telemedicine Encounter    I conducted this encounter from [facility] via secure, live, face-to-face video conference with the patient. Agamjot was located at [facility] with no one else.  I reviewed the risks and benefits of telemedicine as pertinent to this visit and the patient agreed to proceed.       Subjective:     Jose Richards is a 34 year old male who presents on 07/31/2020.    #wrist discomfort/pain:  -f/u from 06/19/20  -reports no change  -tried stretch, ice, ibuprofen, braces  -exacerbating? Has done this a few times  -tried theragun at home - light massage of extensors in forearms/palms - worked well, woke up without pain  -can't do pushups or yoga, pain  -no gaming since  -L still bothering today, R denies pain  -still no tingling or numbness, no dropping stuff,   -denies redness, swelling, fever         Objective:    Vitals: There were no vitals taken for this visit.  Physical Exam  Constitutional:       General: He is not in acute distress.  Pulmonary:      Effort: Pulmonary effort is normal. No respiratory distress.      Breath sounds: Normal breath sounds. No wheezing.   Musculoskeletal:      Left wrist: He exhibits tenderness. He exhibits normal range of motion, no bony tenderness, no swelling and no effusion.      Comments: Pain when tucking thumb and bending wrist, pain at medial wrist near the head of the radius   Neurological:      Mental Status: He is alert and oriented to person, place, and time. Mental status is at baseline.   Psychiatric:         Mood and Affect: Mood normal.         Behavior: Behavior normal.            Assessment and Plan:      Tenosynovitis, de Quervain  Follow-up visit with patient for what sounds like continued de Quervain's tenosynovitis, not improved, though patient admits he has been less diligent about ice, NSAID, splinting.  Given that about 6 weeks have elapsed and patient is able to perform ADL, but  cannot put a lot of pressure on the wrist, counseled on in person evaluation, offered steroid injection to the area, and patient is agreeable to this.  Plan to send message to front desk to schedule with qualified provider    I spent a total time of 30 minutes face-to-face with the patient, of which more than 50% was spent counseling and coordinating care as outlined in this note.

## 2020-07-31 ENCOUNTER — Telehealth (INDEPENDENT_AMBULATORY_CARE_PROVIDER_SITE_OTHER): Payer: No Typology Code available for payment source | Admitting: Family Medicine

## 2020-07-31 DIAGNOSIS — M654 Radial styloid tenosynovitis [de Quervain]: Secondary | ICD-10-CM

## 2020-07-31 NOTE — Patient Instructions (Addendum)
Patient Education     Treating Alfonse Ras Tenosynovitis     The surgery releases the protective sheath, creating more space for the tendons. This allows them to move more freely and relieves inflammation.     The goal of your treatment is to ease your pain and allow you to use your thumb again. Treatment will depend on howbad the pain is.  Nonsurgical Treatment  Just taking a break from the activities that caused your pain may be enough. Yourhealthcareprovidermay also have you take nonsteroidal, anti-inflammatory medicine (NSAIDs), such as ibuprofen. Or you may wear a splint for a few weeks to rest the thumb and wrist. To lesson swelling, yourhealthcare providermay inject an anti-inflammatory medicine, such as cortisone, around the tendons. You may have more pain at first. But in a few days your thumb should feel better.  Surgery  If other kinds of treatment don'tease your pain, or if the pain isbad, you may need surgery. The sheath that surrounds the tendons is released so the tendons can move more easily. This helps reduce the inflammation. It also allows you to straighten your thumb without pain. Surgery is done with local or regional anesthetic on an outpatient basis.So you can go home the same day. You will likely have a splint or dressing on your wrist for a few days while the tissue heals. You may need physical therapy to help strengthen muscles. Yourhealthcare providerwill talk with you about the risks and possible complications of surgery.  [employer] last reviewed this educational content on 10/18/2016   2000-2020 [employer]. [address], [place] [address]. All rights reserved. This information is not intended as a substitute for professional medical care. Always follow your healthcare professional's instructions.

## 2020-08-10 ENCOUNTER — Encounter (INDEPENDENT_AMBULATORY_CARE_PROVIDER_SITE_OTHER): Payer: Self-pay | Admitting: Family Medicine

## 2020-08-10 DIAGNOSIS — M654 Radial styloid tenosynovitis [de Quervain]: Secondary | ICD-10-CM

## 2020-08-13 ENCOUNTER — Encounter (INDEPENDENT_AMBULATORY_CARE_PROVIDER_SITE_OTHER): Payer: Self-pay

## 2020-08-13 NOTE — Telephone Encounter (Signed)
Referral placed for steroid injection

## 2020-08-15 ENCOUNTER — Other Ambulatory Visit: Payer: Self-pay

## 2020-08-15 ENCOUNTER — Ambulatory Visit (INDEPENDENT_AMBULATORY_CARE_PROVIDER_SITE_OTHER): Payer: No Typology Code available for payment source | Admitting: Sports Medicine

## 2020-08-15 VITALS — BP 130/80 | HR 78 | Ht 66.5 in | Wt 136.0 lb

## 2020-08-15 DIAGNOSIS — M7712 Lateral epicondylitis, left elbow: Secondary | ICD-10-CM

## 2020-08-15 DIAGNOSIS — M654 Radial styloid tenosynovitis [de Quervain]: Secondary | ICD-10-CM

## 2020-08-15 MED ORDER — METHYLPREDNISOLONE ACETATE 80 MG/ML IJ SUSP
20.0000 mg | Freq: Once | INTRAMUSCULAR | Status: AC
Start: 2020-08-15 — End: 2020-08-15
  Administered 2020-08-15: 20 mg via PERITENDINOUS

## 2020-08-15 NOTE — Progress Notes (Signed)
[facility]  SPORTS MEDICINE INITIAL VISIT  Patient: Jose Richards  Date: 08/15/2020    Referred by: Lanetta Inch  Primary Care Physician:  Lanetta Inch, Tsosie Billing, MD    Chief Complaint   Patient presents with   . Musculoskeletal Problem     Left wrist pain started in August, has seen his PCP indicated it was Tenosynovitis, de Quervain       HISTORY:    Jose Richards is a RHD 34 year old male who is presenting with left wrist pain for the past 10 weeks.   The pain is located over the base of the thumb.  There is no injury preceding the start of the pain though he did note that he was gaining for a few days before the pain started.  The pain is made worse with axial loading and movement of the thumb.  He has tried ice, bracing, Tylenol and ibuprofen.  He has also tried stretching and limiting aggravating activities.  He continues to have pain at the base of his thumb that is interfering with his ability to participate in exercise.  He denies any numbness or tingling in the left hand.    Mechanism of injury:  No specific injury  Previous treatments: Brace, ice therapy, Tylenol and ibuprofen  Previous injuries: None  Sport/Activity: Yoga, swimming, some gaming   Work: Automotive engineer     Past Medical History: I have reviewed and confirmed the past medical history in the chart.  Past Medical History:   Diagnosis Date   . Migraines        Past Surgical History: I have reviewed and confirmed the past surgical history in the chart.  Past Surgical History:   Procedure Laterality Date   . right shoulder labrum repair         Medications: Reviewed medication list in the chart  Outpatient Medications Prior to Visit   Medication Sig Dispense Refill   . acetaminophen 325 MG tablet Take 325 mg by mouth as needed.     [name] ibuprofen 200 MG tablet Take 400 mg by mouth every 8 hours as needed.       No facility-administered medications prior to visit.        Allergies: Reviewed allergy  section in the chart  Review of patient's allergies indicates:  No Known Allergies    Family History:   Family History     Problem (# of Occurrences) Relation (Name,Age of Onset)    Cancer (2) Maternal Grandfather: pancreatic, other throat, Paternal Grandfather    Depression (1) Brother    Hypertension (2) Mother, Father    Kidney Disorder (1) Paternal Grandfather    Lipids/Cholesterol (1) Mother    Mental Illness (1) Brother    Skin Cancer (2) Maternal Grandfather: basal cell, Paternal Grandfather: basal cell       Negative family history of: Diabetes, Stroke, Heart Disease          Review of Systems: A 14-point review of systems was performed.  With the exception of the HPI, all other review of systems was negative.     PHYSICAL EXAM:      Vitals: BP 130/80   Pulse 78   Ht 5' 6.5" (1.689 m)   Wt 61.7 kg (136 lb)   SpO2 98%   BMI 21.62 kg/m   General: NAD, pleasant & cooperative  Head: EOMI, no facial lesions    A left hand and wrist exam was performed.  Exam Performed: out of splint/cast  Inspection:   Skin: skin intact, good turgor, normal skin exam  Swelling: No  Deformity: None  Muscle atrophy: none    Palpation:   Warmth: negative  Masses: None  Tenderness/Swelling: tenderness over the first compartment tendons.   Volar Scaphoid: no tenderness or swelling, Snuffbox: no tenderness or swelling,     ROM: full and painless  Strength: no change in strength    Inter-Carpal Joints:  Scapho-Lunate Shift (Watson's test): negative    Distal Radio-Ulnar and Ulno-Triquetral Joints:  Foveal Compression test: negative  DRUJ Shift: negative  DRUJ Instability: Neutral      TFCC load: negative  ECU Pain with resistance: negative  FCU Pain with resistance: negative  Finkelstein's Test: positive  CMC Grind Test: negative    Neurological Exam: Normal   Vascular Exam: Normal with palpable radial and ulnar pulses     Carpal Tunnel:  Tinel test: negative  Phalen test: negative    Contralateral hand/wrist: FROM, NTTP, 5/5  strength  Bilateral elbows: No effusion, FROM, NTTP, 5/5 strength, neg Tinel    Skin: No ulcers, erythema, or skin breakdown  Psych: Alert, appropriate affect    IMAGING:  No recent imaging    ASSESSMENT:    Jose Richards is a 34 year old male who presents with left wrist pain. History and examination are concerning for left de Quervain's Tenosynovitis, extensor tendonitis and lateral epicondylitis.  We discussed treatment options for de Quervain's tenosynovitis including bracing, ice therapy, occupational therapy, topical NSAIDs and cortisone injections.  Given his lack of response to conservative management, he would like to proceed with a cortisone injection at this visit for the de Quervain's tenosynovitis.  Following a discussion of risks and benefits, a landmark guided injection was performed in clinic. Aftercare instructions were discussed with the patient who expressed understanding.  In addition to the injection, I recommend occupational therapy, avoidance of aggravating activity, topical NSAIDs, ice therapy and bracing as needed.    RECOMMENDATIONS & PLAN:    Given our clinical suspicion of the above diagnosis, our initial plan is as follows:    1. Tenosynovitis, de Quervain  -Cortisone injection performed in clinic, see procedure note for details  - Referral to OT Occupational Therapy; Future  - methylPREDNISolone acetate (DEPO-Medrol) injection 20 mg  -Continue ice therapy as needed  -Topical Voltaren gel as needed  -Thumb spica brace as needed    2. Left lateral epicondylitis  - Referral to OT Occupational Therapy; Future      Follow-Up: Return in about 8 weeks (around 10/10/2020).    ---------------------------------------------------------------     Excluding the time spent performing the procedure, I spent a total of 30 minutes for the patient's care on the date of the service.     Hermina Staggers, MD  Division of Sports Medicine   Department of [facility] Medicine   08/15/20 7:58 AM

## 2020-08-15 NOTE — Progress Notes (Signed)
 Department of Family Medicine  Division of Sports Medicine    SPORTS MEDICINE PROCEDURE NOTE      PROCEDURE: Left DeQuervain's Tenosynovitis Cortisone Injection    Date of Procedure: 08/15/20    Performing Physicians: Hermina Staggers, MD    Pre-Procedure Diagnosis: Left DeQuervain's Tenosynovitis     Post-Procedure Diagnosis: Left DeQuervain's Tenosynovitis    Informed Consent Discussion:  The risks, benefits, and alternatives to the injection were discussed in detail with the patient. The patient and/or patient's surrogate voiced an understanding of the risks, potential benefits and limitations of the injection and wishes to proceed. The patient understands the risks to include and not be limited to: bleeding, infection, damage to nerves, arteries and other soft tissues, continued pain, swelling, allergic reaction, failure of the procedure to relieve symptoms, worsening of symptoms, and future arthritis.    Equipment:  Injection: 20mg  methylprednisolone with 0.5cc 1% lidocaine without epinephrine  Needle: 25G 1.5"    Preparation: Chlorhexidine    Approach: Direct into the first compartment tendon sheath    Patient tolerated the procedure well. Injection site was dressed with a band-aid.    Complications: None    Hermina Staggers, MD   Division of Sports Medicine  Department of [facility]  [facility]

## 2020-08-15 NOTE — Patient Instructions (Signed)
Joint Injection Aftercare    After any type of joint injection, it is not uncommon to experience:  -Soreness, swelling, or bruising around the injection site.  -Mild numbness, tingling, or weakness around the injection site caused by the numbing medicine used before or with the injection.    It also is possible to experience the following effects associated with the specific agent after injection:  -Allergic reaction.  -Increased blood sugar levels (If you have diabetes and you notice that your blood sugar levels have increased, notify your caregiver).  -Increased blood pressure levels.  -Mood swings.  -Increased fluid accumulation in the injected joint.    These effects all should resolve within a day after your procedure.    HOME CARE INSTRUCTIONS  -Limit yourself to light activity the day of your procedure. Avoid lifting heavy objects, bending, stooping, or twisting.  -Take prescription or over-the-counter pain medication as directed by your caregiver.  -You may apply ice to your injection site to reduce pain and swelling the day of your procedure. Ice may be applied 3 to 4 times:       +Put ice in a plastic bag.       +Place a towel between your skin and the bag.       +Leave the ice on for no longer than 15 to 20 minutes each time.    SEEK IMMEDIATE MEDICAL CARE IF:  -Pain and swelling get worse rather than better or extend beyond the injection site.  -Numbness does not go away.  -Blood or fluid continues to leak from the injection site.  -You have chest pain.  -You have swelling of your face or tongue.  -You have trouble breathing or you become dizzy.  -You develop a fever, chills, or severe tenderness at the injection site that last longer than 1 day.    MAKE SURE YOU:  -Understand these instructions.  -Watch your condition.  -Get help right away if you are not doing well or if you get worse.

## 2020-08-21 ENCOUNTER — Other Ambulatory Visit: Payer: Self-pay

## 2020-09-04 ENCOUNTER — Encounter (INDEPENDENT_AMBULATORY_CARE_PROVIDER_SITE_OTHER): Payer: Self-pay | Admitting: Family Medicine

## 2020-09-04 ENCOUNTER — Telehealth (INDEPENDENT_AMBULATORY_CARE_PROVIDER_SITE_OTHER): Payer: No Typology Code available for payment source | Admitting: Family Medicine

## 2020-09-04 DIAGNOSIS — R42 Dizziness and giddiness: Secondary | ICD-10-CM

## 2020-09-04 NOTE — Progress Notes (Signed)
No chief complaint on file.    Distant Site Telemedicine Encounter    I conducted this encounter from [facility] via secure, live, face-to-face video conference with the patient. Jose Richards was located at [facility] with no one else.  I reviewed the risks and benefits of telemedicine as pertinent to this visit and the patient agreed to proceed.       Subjective:     Jose Richards is a 34 year old male who presents on 09/04/2020.    Agenda: lightheaded/dizziness    HPI:    Character: "lightheadedness/dizziness"; clarifies as room not spinning, not nausea, like presyncope  Onset: 2 days ago, out of the blue, COVID booster last TH  Location: n/a   Duration: 2 days  Remitting factors: sitting down, resting; not feeling it when wakes up for the day, happens when sees the sunlight;   Exacerbating factors: when at work with fluorescent lights, driving and when peripheral vision passes things  Associated sx: no falls, no syncope, no nausea, no vomiting, no headache  Radiation: n/a  Sick contacts: n/a  Intensity: 0/10  Timing: comes and goes, episodes last 1-2 hours at a time  Significance: no change  -had vertigo like this during migraines; but no migraine at this time  -+fatigued  -no hearing loss or change, no ear pain  -+happens with positional changes of head  -no daily meds  -never had this before       Objective:    Vitals: There were no vitals taken for this visit.  Physical Exam  Constitutional:       General: He is not in acute distress.  Pulmonary:      Effort: Pulmonary effort is normal. No respiratory distress.      Breath sounds: Normal breath sounds. No wheezing.   Neurological:      Mental Status: He is alert and oriented to person, place, and time. Mental status is at baseline.      Cranial Nerves: No cranial nerve deficit.      Sensory: No sensory deficit.      Gait: Gait normal.      Comments: Appears to have positive Dix-Hallpike maneuver, dizziness noted on standing from lying down, patient  had to support himself   Psychiatric:         Mood and Affect: Mood normal.         Behavior: Behavior normal.            Assessment and Plan:      Dizziness  Patient history of migraines with accompanying dizziness much like what he is experiencing today.  However no headaches at this time, no red flags to suggest brain imaging needed, however while neurologic exam via telemedicine is grossly normal, requested patient come in for full evaluation and consideration of imaging or further referral after an in person consultation.  His history and symptoms with trigger on head movement today suggest BPPV, versus he also relates that some of the triggers for migraines seem to bring on this feeling of dizziness, which raises concern for vestibular migraine.  The patient declines pharmacologic therapy for now, counseled on BPPV exercises and plan to have in person visit to evaluate further and follow-up for resolution of symptoms with Epley maneuvers and consider imaging or referral.  No recent illness or hearing loss to suggest Mnire's, no ear pain to suggest otitis, possible orthostatic hypotension noted, reports good control of anxiety and depression, less likely psychiatric

## 2020-09-04 NOTE — Patient Instructions (Signed)
[email or web address]    Patient Education     Benign Paroxysmal Positional Vertigo     Your health care provider may move your head in certain ways to treat your BPPV.   Benign paroxysmal positional vertigo (BPPV) is a problem with the inner ear. The inner ear contains the vestibular system. This system is what helps you keep your balance. BPPV causes a feeling of spinning. It is a common problem of the vestibular system.   Understanding the vestibular system  The vestibular system of the ear is made up of very tiny parts. They include the utricle, saccule, and semicircular canals. The utricle is a tiny organ that contains calcium crystals. In some people, the crystals can move into the semicircular canals. When this happens, the system no longer works as it should. This causes BPPV. Benign means it is not life threatening. Paroxysmal means it happens suddenly. Positional means that it happens when you move your head. Vertigo is a feeling of spinning.   What causes BPPV?  Causes include injury to your head or neck. Other problems with the vestibular system may cause BPPV. In many people, the cause of BPPV is not known.   Symptoms of BPPV  You may have repeated feelings of spinning (vertigo). The vertigo usually lasts less than 1 minute. Some movements, such as rolling over in bed, can bring on vertigo.   Diagnosing BPPV  Your primary healthcare provider may diagnose and treat your BPPV. Or you may see an ear, nose, and throat healthcare provider (otolaryngologist). In some cases, you may see a healthcare provider who focuses on the nervous system (neurologist).   The healthcare provider will ask about your symptoms and your medical history. He or she will examine you. You may have hearing and balance tests. As part of the exam, your healthcare provider may have you move your head and body in certain ways. If you have BPPV, the movements can bring on vertigo. Your provider will also look for  abnormal movements of your eyes. You may have other tests to check your vestibular or nervous systems.   Treatment for BPPV  Your healthcare provider may try to move the calcium crystals. This is done by having you move your head and neck in certain ways. This treatment is safe and often works well. You may also be told to do these movements at home. You may still have vertigo for a few weeks. Your healthcare provider will recheck your symptoms, usually in about a month. Special physical therapy may also be part of treatment. In rare cases, surgery may be needed for BPPV that does not go away. Talk with your healthcare provider about whether it is safe for you to drive.   When to call the healthcare provider  Call your healthcare provider right away if you have any of these:   Symptoms that do not go away with treatment   Symptoms that get worse   New symptoms  StayWell last reviewed this educational content on 11/18/2018   2000-2020 [employer]. [address] [place] [address]. All rights reserved. This information is not intended as a substitute for professional medical care. Always follow your healthcare professional's instructions.           Patient Education     Inner Ear Problems: Causes of Dizziness (Vertigo)        Benign positional vertigo (BPV)   This is the most common cause of vertigo. BPV is also called benign  positional paroxysmal vertigo (BPPV). It happens when crystals in the ear canals shift into the wrong place. Vertigo usually occurs when you move your headin a certain way. This can happen when turning in bed, bending, or looking up. Because BPV comes on quickly, you should think about if you are safe to drive or do other tasks that need your full attention.   BPV:   Causes vertigo that lasts for seconds. Vertigo can occur several times a day, depending on body position.   Doesnt cause hearing loss.   Often goes away on its own. But it may go away sooner with  treatment.  Infection or inflammation  Sometimes the semicircular canals swell and send incorrect balance signals. This problem may be caused by a viral infection. Depending on the cause, your hearing can be affected (labyrinthitis). Or your hearing can remain normal (neuronitis).   Infection or inflammation:   Causes vertigo that lasts for hours or days. The first episode is usually the worst.   Can cause hearing loss.   Often goes away on its own. But it may go away sooner with treatment.  You may need vestibular rehabilitation if you havebalance problems that don't go away.   Menieres disease  This condition is uncommon. It happens when there is too much fluid in the ear canals. This causes increased pressure and swelling. It affects balance and hearing signals.   Menieres disease may:   Cause vertigo that last for hours   Cause hearing problems that come and go. The problems are usually in one ear and get worse over time.   Cause buzzing or ringing in the ears (tinnitus)   Cause a feeling of fullness or pressure in the ear   Cause any of these lasting symptoms:vertigo, hearing loss, tinnitus, or ear fullness  Other causes of vertigo  Vertigo can also be caused by:    A head injury   Certain medicines   Migraines   Brain problems, such as a stroke or bleeding in the brain    StayWell last reviewed this educational content on 11/18/2018   2000-2020 [employer]. [address] [place] [address]. All rights reserved. This information is not intended as a substitute for professional medical care. Always follow your healthcare professional's instructions.           Patient Education     Managing Balance Problems: Vestibular Rehabilitation Therapy    An inner ear problem can knock out part of the balance system. Vestibular rehabilitation therapy helps you learn how to rely on specific parts of your balance system.   Checking eye movement  The therapist will check for nystagmus.  This is an automatic, jumpy eye movement. Nystagmus in certain positions can mean an inner ear problem. It can even show which semicircular canal is affected. The therapist will watch your eyes while you move into different positions.   Treating your condition  Treatment can include:   Canalith repositioning. This is a series of guided head and body movements. It helps move crystals, easing symptoms of benign positional vertigo (BPV).   Habituation exercises to retrain your balance system. The therapist will teach you how to do these exercises at home.   Gaze stabilization exercises. These areto retrain the eyes to stay in focus while your head moves. This helps ease the feeling of being unsteady (disequilibrium).   Gait and balance training.  This includes standing and walking on different surfaces. The therapist can teach you  how to keep your balance and prevent falls.  Doing habituation exercises  The therapist will teach exercises suited to your condition. Habituation exercises will make you dizzy at first. Just remind yourself that symptoms likely will last for only a minute. If you keep doing the exercises, they will help lessen your dizziness. Then when you do a movement like that in daily life, it is less likely to bring on symptoms.   Getting back into action  Dizziness doesn't have to keep you from exercising. Start with activities that don't make you dizzy. Then ease into more challenging activities. Try these tips:    Always exercise with a partner.   Stop if you have nausea or feel that you may faint.   Walk on a treadmill, holding on to the handles for support. Be sure you are connected to the treadmill's emergency stop.   Use a ball machine for sports such as tennis until you're ready for a live game. This way you know where to expect the ball.   Don't give up. With time, most people can return to activities and sports.  StayWell last reviewed this educational content on 06/18/2018    2000-2020 [employer]. [address] [place] [address]. All rights reserved. This information is not intended as a substitute for professional medical care. Always follow your healthcare professional's instructions.           Patient Education     Labyrinthitis    Labyrinthitis is the inflammation of part of the inner ear called the labyrinth. It usually affects only one ear. A nerve in the head called the 8th cranial nerve may also be inflamed. Labyrinthitis causes a sense of spinning and hearing loss. In most people these go away over time.  Understanding the inner ear  The inner ear has a system of fluid-filled tubes and sacs. This system is called the labyrinth. Inside the inner ear, the cochlea senses sound. The vestibular organs take in sense motion and changes in space. These create your sense of balance. The 8thcranial nerve (vestibulocochlear nerve) sends all of this information from the inner ear to the brain.  When 1 of the nerves or the labyrinth is infected, it can become inflamed. This can cause it to not work normally. It may cause hearing loss in 1 ear. The brain now has to make sense of the information that doesn't match between the normal nerve and the infected one. This causes a feeling that the world is spinning around you (vertigo).  What causes labyrinthitis?  A viral infection may cause the condition. The virus may have spread throughout your body. Or it may only affect the labyrinth and 8thcranial nerve. Usually only 1 nerve is affected. Viruses that can cause labyrinthitis include:   Herpes viruses   Influenza   Measles   Mumps   Rubella   Polio   Hepatitis   Epstein-Barr   Varicella  Chronic bacterial infections of the middle ear can spread to the inner ear and cause labyrinthitis. In rare cases bacterial meningitis or head trauma may cause labyrinthitis. In other cases the cause of labyrinthitis is not known.  Symptoms of labyrinthitis  Symptoms of  labyrinthitis of may include:   A feeling of spinning (vertigo)   Dizziness   Lack of balance when walking   Nausea and vomiting   Trouble concentrating   Back-and-forth eye movements that you can't control (nystagmus)   Hearing loss   Ringing in the  ears  Symptoms may range from mildtosevere. Severe symptoms such as vertigo can cause problems with standing and walking.Symptoms may come on very quicklyand start during routine daily activities. Or you may start to have symptoms when you wake up in the morning. In many people, these symptoms go away over several weeks. Or they can last longer.  Diagnosing labyrinthitis  Your healthcare provider will ask about your past health. You may also have a physical exam. This may include hearing and balance tests. It will also include an exam of your nervous system. There are no tests for labyrinthitis. But your healthcare provider may have you take an imaging test. Many health conditions can cause dizziness and vertigo. Your healthcare provider will need to make sure you don't have another condition that causes these symptoms, such as stroke.  You may have tests such as:   MRI, to check for a stroke   Electrocardiogram (ECG), to check for cardiovascular causes   Electronystagmography (ENG) or videonystagmography (VNG). Either of these records your eye movement to find where the problem is in your vestibular system. These tests can find the cause of a balance disorder.  Treatment for labyrinthitis  Treatment depends on your overall health and symptoms. Treatment for labyrinthitis may include:   Corticosteroids to help reduce inflammation of the nerve   Antiviral medicines   Antibiotics if you have signs of a bacterial infection   Medicines to take for a short time that control nausea and dizziness, such as diphenhydramine or lorazepam  If your symptoms go away in a few weeks, you likely won't need other treatment. If you have symptoms that don't go away, you  may need to do certain exercises. These are known as vestibular rehabilitation exercises. They are a form of physical therapy. These exercises may help your brain learn to adjust to the vestibular imbalance. A physical therapist will teach you the exercises. Then you can do them at home.  Possible complications of labyrinthitis  In most cases labyrinthitis does not cause any complications. In rare cases it may permanently damage the 8th cranial nerve. This can cause lasting problems with balance. It can also cause partial or total loss of hearing. You may need to use a hearing aid. Getting treated right away can help reduce your risk for these problems.    When to call the healthcare provider  Call your healthcare provider right away if you have any of these:   Symptoms that get worse, or don't get better with treatment   New symptoms   StayWell last reviewed this educational content on 08/18/2016   2000-2020 [employer]. [address] [place] [address]. All rights reserved. This information is not intended as a substitute for professional medical care. Always follow your healthcare professional's instructions.

## 2020-09-04 NOTE — Progress Notes (Signed)
Patient Rooming (in-clinic or Telemed): in clinic    Reason for Visit:   Chief Complaint   Patient presents with    Dizziness     light headedness for two days , BP was arouns 120/83 today         Refills? NO  Referral? NO  Letter or Form? NO  Lab Results? NO    HEALTH MAINTENANCE:  Has the patient had this done since their last visit?  Cervical screening/PAP: N/A  Mammo: N/A  Colon Screen: N/A    Have you seen a specialist since your last visit: No    Vaccines Due? Yes, see below     HM Due:   Health Maintenance   Topic Date Due    Depression Monitoring (PHQ-9)  01/18/2020    Influenza Vaccine (1) 07/18/2020    DTaP, Tdap, and Td Vaccines (2 - Td or Tdap) 08/23/2028    Hepatitis C Screening  Completed    HIV Screening  Completed    COVID-19 Vaccine  Completed    Hepatitis A Vaccine  Aged Out    Hepatitis B Vaccine  Aged Out    Pneumococcal Vaccine: Pediatrics (0-5 years) and At-Risk Patients (6-64 years)  Aged Out       PCP Verified?  Yes, Lanetta Inch, Tsosie Billing, MD

## 2020-09-19 ENCOUNTER — Encounter (HOSPITAL_BASED_OUTPATIENT_CLINIC_OR_DEPARTMENT_OTHER): Payer: Commercial Managed Care - PPO | Admitting: Rehabilitative and Restorative Service Providers"

## 2020-09-25 ENCOUNTER — Ambulatory Visit
Payer: Commercial Managed Care - PPO | Attending: Sports Medicine | Admitting: Rehabilitative and Restorative Service Providers"

## 2020-09-25 ENCOUNTER — Encounter (HOSPITAL_BASED_OUTPATIENT_CLINIC_OR_DEPARTMENT_OTHER): Payer: Self-pay

## 2020-09-25 DIAGNOSIS — M7712 Lateral epicondylitis, left elbow: Secondary | ICD-10-CM

## 2020-09-25 DIAGNOSIS — M654 Radial styloid tenosynovitis [de Quervain]: Secondary | ICD-10-CM | POA: Insufficient documentation

## 2020-09-25 NOTE — Progress Notes (Signed)
HAND THERAPY INITIAL EVALUATION      This note serves as CMS evaluation form that requires the referring provider to certify the need for therapy services furnished under the Plan of Treatment. The signing provider is certifying the plan of care.    GENERAL VISIT INFO  Encounter Diagnoses   Name Primary?    Tenosynovitis, de Quervain Yes    Left lateral epicondylitis      Referring Provider: Thayer Headings*    Certification From*: 95/62/13  Certification To*: 08/65/78  Date of Symptom Onset*: 08/15/20  Start of Care Date*: 09/25/20  Reason for Referral*: 34 yo male with left DeQuiervain's tenosynovitis.       VISIT COUNT    1       EVALUATION       General Assessment  Certification From*: 46/96/29  Certification To*: 52/84/13  Date of Symptom Onset*: 08/15/20  Start of Care Date*: 09/25/20  Reason for Referral*: 34 yo male with left DeQuiervain's tenosynovitis.  Precautions: None  Aggravating Factors: excessive use, sudden movement, weight bearing exercise  Relieving Factors: rest, forearm massage with theragun  Patient Outcome Tool: QuickDash  Outcome Score: 18  Have you fallen in the past year?: No  Do you worry about falling?: No  Do you feel unsteady when standing or walking?: No  Fall Screen Date: 09/25/20   Based on screening, patient's fall risk is low     MECHANISM OF INJURY  Left hand, wrist and forearm pain started in August of this year. He had an injection in radial wrist (APL tendon), pain improved, but only temporarily.      PRIOR FUNCTION      Prior Level of Function  Level of Independence: Independent with ADLs and functional transfers;Independent with homemaking with ambulation  Ambulation: Independent  Vocational: Full time employment      Past Medical History:   Diagnosis Date    Migraines         FUNCTIONAL LIMITATIONS  Left hand/wrist/forearm pain results in functional limitations with:  Wash dishes  Open a jar  Weight bearing       Pain:  Pain from distal web to radial styloid and  IF MC over 1st dorsal interossei. Pain doe not wake him up. 2-3/10 and can increase to 6/10 when aggravated. He still babies it quite a bit. Still gets shooting pain. Not nearly as bad as it was before.    EXAMINATION/OBJECTIVE      ROM:  B UE AROM WNL.  Stretch over web space, radial styloid and into ext wad with wrist flexion    WRIST ROM:  AROM (PROM)   RIGHT LEFT  Comment   Extension 70 75    Flexion  70 75    UD 28 30 Stretch toward styloid   RD 20 15      THUMB ROM: AROM (PROM)   R THUMB L THUMB Comment   MP      IP      PAbd      RAbd      Opposition 0.5 cm to base of SF 2.0 cm to base of SF      Sensation:   Pt reports normal sensation. Light touch intact to all fingertips.     Integumentary:  None    Edema:  DWC - Right 16.0 cm, Left 16.5 cm.    Palpation:     TTP over first dorsal interossei down toward base of MC, No TTP al lother structures  TTP over ext wad more laterally,  no TTP ECRB tendon    Dexterity/Coordination:  Mildly decreased    Strength:   Right HD     Right Left Comments   Grip 95# 90# No pain   Pinch Lat 22# 22#    Pinch 3 pt 19# 18#    Grip with arm straight out in front - Right 100#, Left 95#    Special Tests:   LEFT  Resisted IF abduction - pain over dorsal radial wrist  Resisted thumb Rabd - pressure over dorsal radial wrist  Resisted wrist and MFextension - negative  Finkelsteins B - negative    Orthosis: Used OTC orthosis full time 2 weeks at a time x 2, didn't feel like it helped.    TREATMENT    Initial evaluation completed.  Instructed in HEP including-  Heat to left wrist and forearm x 8 min  STM over dorsal forearm and radial wrist.  Stretches - dorsal forearm, radial forearm and first dorsal interossei  Written instructions given.    EDUCATION      Education  Primary Learner: Patient  Preferred Language: English  Education Preference: Verbal;Reading/Handout;Demonstration;Pictures/Handout  Method of Education: Verbal;Demonstration;Handout  Response to Education: Demonstrates  independently  Barriers to Education: None  Cultural practices that influence care: None    Educated in pertinent anatomy and anticipated progression through therapy.    ASSESSMENT   Left hand, wrist and forearm pain started in August of this year. He had an injection in radial wrist (APL tendon), pain improved, but only temporarily. Pain is causing some functional limitations. Hand therapy is medically necessary to reduce pain and increased function through heat modalities, STM, [place], stretches, custom orthosis fabrication, strengthening, HEP and pt eductiton. 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 He enjoys yoga, video games and is learning how to play the piano.    GOALS      Therapy Goals  Patient Goals: Use left hand/arm without pain  Potential Barriers to Achieving Goals: None Defined    STG: 2 weeks (10/09/20)  Independent HEP.  Independent use of custom orthosis.    LTG: 8 weeks (11/20/20)  Pt to wash dishes at work with less than 3/10 pain.  Pt to open a jar using adaptive equipment with less than 3/10 pain.  Pt to do yoga with modifictions for weight bearing poses with less than 1/10 pain.      Patient requires skilled therapy to restore prior level of function utilizing the treatment and modalities described in this plan of care.     Rehab Potential: Good    PLAN       Planned Intervention(s)*: Occupational Therapy Evaluation;Orthosis Fitting/training;Patient Education;Self care / ADL Management;Therapeutic Exercise;Manual Therapy Techniques;Ultrasound         Therapy Review Date: 11/20/20    Therapy Frequency: 1/wk      Next visit: Consider custom orthosis. Cont with heat STM, stretches. Add [place].      Thank you for this referral.    Joan Mayans, OTR/L, CLT, CHT

## 2020-10-02 ENCOUNTER — Ambulatory Visit (HOSPITAL_BASED_OUTPATIENT_CLINIC_OR_DEPARTMENT_OTHER): Payer: Commercial Managed Care - PPO | Admitting: Rehabilitative and Restorative Service Providers"

## 2020-10-02 DIAGNOSIS — M654 Radial styloid tenosynovitis [de Quervain]: Secondary | ICD-10-CM

## 2020-10-02 DIAGNOSIS — M7712 Lateral epicondylitis, left elbow: Secondary | ICD-10-CM

## 2020-10-02 NOTE — Progress Notes (Signed)
HAND THERAPY DAILY NOTE          GENERAL VISIT INFO  Encounter Diagnoses   Name Primary?    Tenosynovitis, de Quervain Yes    Left lateral epicondylitis      Referring Provider: Thayer Headings*    Certification From*: 91/63/84  Certification To*: 66/59/93  Date of Symptom Onset*: 08/15/20  Start of Care Date*: 09/25/20  Reason for Referral*: 34 yo male with left DeQuiervain's tenosynovitis.       VISIT COUNT    2       EVALUATION       General Assessment  Certification From*: 57/01/77  Certification To*: 93/90/30  Date of Symptom Onset*: 08/15/20  Start of Care Date*: 09/25/20  Reason for Referral*: 34 yo male with left DeQuiervain's tenosynovitis.  Precautions: None  Aggravating Factors: excessive use, sudden movement, weight bearing exercise  Relieving Factors: rest, forearm massage with theragun  Patient Outcome Tool: QuickDash  Outcome Score: 18  Have you fallen in the past year?: No  Do you worry about falling?: No  Do you feel unsteady when standing or walking?: No  Fall Screen Date: 09/25/20   Based on screening, patient's fall risk is low     MECHANISM OF INJURY  Left hand, wrist and forearm pain started in August of this year. He had an injection in radial wrist (APL tendon), pain improved, but only temporarily.      PRIOR FUNCTION      Prior Level of Function  Level of Independence: Independent with ADLs and functional transfers;Independent with homemaking with ambulation  Ambulation: Independent  Vocational: Full time employment      Past Medical History:   Diagnosis Date    Migraines         FUNCTIONAL LIMITATIONS  Left hand/wrist/forearm pain results in functional limitations with:  Wash dishes  Open a jar  Weight bearing       Pain:  Has had to work a lot with a lot of dish work and food prep. Doesn't yet have heating pad, but doing massage and stretch. Doing HEP 1x/day due to busy schedule. It's feeling a little worse, 2/10.  0.5 can feel it all the time. No shooting pains. Feels pain a  little while after use.     EXAMINATION/OBJECTIVE      ROM:  B UE AROM WNL.  Stretch over web space, radial styloid and into ext wad with wrist flexion    WRIST ROM:  AROM (PROM)   RIGHT LEFT  Comment   Extension 70 75    Flexion  70 75    UD 28 30 Stretch toward styloid   RD 20 15      THUMB ROM: AROM (PROM)   R THUMB L THUMB Comment   MP      IP      PAbd      RAbd      Opposition 0.5 cm to base of SF 2.0 cm to base of SF      Sensation:   Pt reports normal sensation. Light touch intact to all fingertips.     Integumentary:  None    Edema:  DWC - Right 16.0 cm, Left 16.5 cm.    Palpation:     TTP over first dorsal interossei down toward base of MC, No TTP al lother structures  TTP over ext wad more laterally,  no TTP ECRB tendon    Dexterity/Coordination:  Mildly decreased    Strength:  Right HD     Right Left Comments   Grip 95# 90# No pain   Pinch Lat 22# 22#    Pinch 3 pt 19# 18#    Grip with arm straight out in front - Right 100#, Left 95#    Special Tests:   LEFT  Resisted IF abduction - pain over dorsal radial wrist  Resisted thumb Rabd - pressure over dorsal radial wrist  Resisted wrist and MFextension - negative  Finkelsteins B - negative    Orthosis: Used OTC orthosis full time 2 weeks at a time x 2, didn't feel like it helped.    TREATMENT    Heat to left wrist and forearm x 8 min  STM over dorsal forearm and radial wrist.  Stretches - dorsal forearm, radial forearm and first dorsal interossei  Added [place] over dorsal first webspace 3.3 MHz 0.8 w/cm2 x 5 min with good tolerance.  Written instructions given.    EDUCATION      Education  Primary Learner: Patient  Preferred Language: English  Education Preference: Verbal;Reading/Handout;Demonstration;Pictures/Handout  Method of Education: Verbal;Demonstration;Handout  Response to Education: Demonstrates independently  Barriers to Education: None  Cultural practices that influence care: None    Educated in pertinent anatomy and anticipated progression through  therapy.    ASSESSMENT   Left hand, wrist and forearm pain started in August of this year. He had an injection in radial wrist (APL tendon), pain improved, but only temporarily. Pain is causing some functional limitations. Hand therapy is medically necessary to reduce pain and increased function through heat modalities, STM, [place], stretches, custom orthosis fabrication, strengthening, HEP and pt eductiton. 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 He enjoys yoga, video games and is learning how to play the piano. Pt reports good HEP follow through. Slight improvement in pain.    GOALS      Therapy Goals  Patient Goals: Use left hand/arm without pain  Potential Barriers to Achieving Goals: None Defined    STG: 2 weeks (10/09/20)  Independent HEP.  Independent use of custom orthosis.    LTG: 8 weeks (11/20/20)  Pt to wash dishes at work with less than 3/10 pain.  Pt to open a jar using adaptive equipment with less than 3/10 pain.  Pt to do yoga with modifictions for weight bearing poses with less than 1/10 pain.      Patient requires skilled therapy to restore prior level of function utilizing the treatment and modalities described in this plan of care.     Rehab Potential: Good    PLAN       Planned Intervention(s)*: Occupational Therapy Evaluation;Orthosis Fitting/training;Patient Education;Self care / ADL Management;Therapeutic Exercise;Manual Therapy Techniques;Ultrasound         Therapy Review Date: 11/20/20    Therapy Frequency: 1/wk      Next visit: Consider custom orthosis. Cont with heat STM, stretches. Monitor tolerance to [place].[name]      Thank you for this referral.    Joan Mayans, OTR/L, CLT, CHT

## 2020-10-08 ENCOUNTER — Ambulatory Visit (HOSPITAL_BASED_OUTPATIENT_CLINIC_OR_DEPARTMENT_OTHER): Payer: Commercial Managed Care - PPO | Admitting: Rehabilitative and Restorative Service Providers"

## 2020-10-08 DIAGNOSIS — M654 Radial styloid tenosynovitis [de Quervain]: Secondary | ICD-10-CM

## 2020-10-08 DIAGNOSIS — M7712 Lateral epicondylitis, left elbow: Secondary | ICD-10-CM

## 2020-10-08 NOTE — Progress Notes (Signed)
HAND THERAPY DAILY NOTE          GENERAL VISIT INFO  Encounter Diagnoses   Name Primary?    Tenosynovitis, de Quervain Yes    Left lateral epicondylitis      Referring Provider: Thayer Headings*    Certification From*: 13/08/65  Certification To*: 78/46/96  Date of Symptom Onset*: 08/15/20  Start of Care Date*: 09/25/20  Reason for Referral*: 34 yo male with left DeQuiervain's tenosynovitis.       VISIT COUNT    3       EVALUATION       General Assessment  Certification From*: 29/52/84  Certification To*: 13/24/40  Date of Symptom Onset*: 08/15/20  Start of Care Date*: 09/25/20  Reason for Referral*: 34 yo male with left DeQuiervain's tenosynovitis.  Precautions: None  Aggravating Factors: excessive use, sudden movement, weight bearing exercise  Relieving Factors: rest, forearm massage with theragun  Patient Outcome Tool: QuickDash  Outcome Score: 18  Have you fallen in the past year?: No  Do you worry about falling?: No  Do you feel unsteady when standing or walking?: No  Fall Screen Date: 09/25/20   Based on screening, patient's fall risk is low     MECHANISM OF INJURY  Left hand, wrist and forearm pain started in August of this year. He had an injection in radial wrist (APL tendon), pain improved, but only temporarily.      PRIOR FUNCTION      Prior Level of Function  Level of Independence: Independent with ADLs and functional transfers;Independent with homemaking with ambulation  Ambulation: Independent  Vocational: Full time employment      Past Medical History:   Diagnosis Date    Migraines         FUNCTIONAL LIMITATIONS  Left hand/wrist/forearm pain results in functional limitations with:  Wash dishes  Open a jar  Weight bearing       Pain:  Has had to work a lot with a lot of dish work and food prep. No shooting pains. Feels pain a little while after use.     EXAMINATION/OBJECTIVE      ROM:  B UE AROM WNL.  Stretch over web space, radial styloid and into ext wad with wrist flexion    WRIST ROM:   AROM (PROM)   RIGHT LEFT  Comment   Extension 70 75    Flexion  70 75    UD 28 30 Stretch toward styloid   RD 20 15      THUMB ROM: AROM (PROM)   R THUMB L THUMB Comment   MP      IP      PAbd      RAbd      Opposition 0.5 cm to base of SF 2.0 cm to base of SF      Sensation:   Pt reports normal sensation. Light touch intact to all fingertips.     Integumentary:  None    Edema:  DWC - Right 16.0 cm, Left 16.5 cm.    Palpation:     TTP over first dorsal interossei down toward base of MC, No TTP al lother structures  TTP over ext wad more laterally,  no TTP ECRB tendon    Dexterity/Coordination:  Mildly decreased    Strength:   Right HD     Right Left Comments   Grip 95# 90# No pain   Pinch Lat 22# 22#    Pinch 3 pt 19# 18#  Grip with arm straight out in front - Right 100#, Left 95#    Special Tests:   LEFT  Resisted IF abduction - pain over dorsal radial wrist  Resisted thumb Rabd - pressure over dorsal radial wrist  Resisted wrist and MFextension - negative  Finkelsteins B - negative    Orthosis: Used OTC orthosis full time 2 weeks at a time x 2, didn't feel like it helped.    TREATMENT   Pain is no longer constant and is a bit less severe.   Heat to left wrist and forearm x 8 min  Using new heating pad at home for heat prior to ex (HEP 1-2x/day).  Also doing some exercises in between formal HEP sessions.  STM over dorsal forearm and radial wrist.  Stretches - dorsal forearm, radial forearm and first dorsal interossei  Added [place] over dorsal first webspace 3.3 MHz 0.8 w/cm2 x 5 min with good tolerance.  Written instructions given.    EDUCATION      Education  Primary Learner: Patient  Preferred Language: English  Education Preference: Verbal;Reading/Handout;Demonstration;Pictures/Handout  Method of Education: Verbal;Demonstration;Handout  Response to Education: Demonstrates independently  Barriers to Education: None  Cultural practices that influence care: None    Educated in pertinent anatomy and anticipated  progression through therapy.    ASSESSMENT   Left hand, wrist and forearm pain started in August of this year. He had an injection in radial wrist (APL tendon), pain improved, but only temporarily. Pain is causing some functional limitations. Hand therapy is medically necessary to reduce pain and increased function through heat modalities, STM, [place], stretches, custom orthosis fabrication, strengthening, HEP and pt eductiton. 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 He enjoys yoga, video games and is learning how to play the piano. Pt reports good HEP follow through. Slight improvement in pain.    GOALS      Therapy Goals  Patient Goals: Use left hand/arm without pain  Potential Barriers to Achieving Goals: None Defined    STG: 2 weeks (10/09/20)  Independent HEP.  Independent use of custom orthosis.    LTG: 8 weeks (11/20/20)  Pt to wash dishes at work with less than 3/10 pain.  Pt to open a jar using adaptive equipment with less than 3/10 pain.  Pt to do yoga with modifictions for weight bearing poses with less than 1/10 pain.      Patient requires skilled therapy to restore prior level of function utilizing the treatment and modalities described in this plan of care.     Rehab Potential: Good    PLAN       Planned Intervention(s)*: Occupational Therapy Evaluation;Orthosis Fitting/training;Patient Education;Self care / ADL Management;Therapeutic Exercise;Manual Therapy Techniques;Ultrasound         Therapy Review Date: 11/20/20    Therapy Frequency: 1/wk      Next visit: Consider custom orthosis. Cont with heat STM, stretches. Monitor tolerance to [place].[name]      Thank you for this referral.    Joan Mayans, OTR/L, CLT, CHT

## 2020-10-22 ENCOUNTER — Encounter (HOSPITAL_BASED_OUTPATIENT_CLINIC_OR_DEPARTMENT_OTHER): Payer: Self-pay | Admitting: Rehabilitative and Restorative Service Providers"

## 2020-10-28 ENCOUNTER — Ambulatory Visit
Payer: Commercial Managed Care - PPO | Attending: Sports Medicine | Admitting: Rehabilitative and Restorative Service Providers"

## 2020-10-28 DIAGNOSIS — M7712 Lateral epicondylitis, left elbow: Secondary | ICD-10-CM | POA: Insufficient documentation

## 2020-10-28 DIAGNOSIS — M654 Radial styloid tenosynovitis [de Quervain]: Secondary | ICD-10-CM | POA: Insufficient documentation

## 2020-10-28 NOTE — Progress Notes (Signed)
HAND THERAPY DAILY NOTE          GENERAL VISIT INFO  Encounter Diagnoses   Name Primary?    Tenosynovitis, de Quervain Yes    Left lateral epicondylitis      Referring Provider: Thayer Headings*    Certification From*: 49/70/26  Certification To*: 37/85/88  Date of Symptom Onset*: 08/15/20  Start of Care Date*: 09/25/20  Reason for Referral*: 35 yo male with left DeQuiervain's tenosynovitis.       VISIT COUNT    4       EVALUATION       General Assessment  Certification From*: 50/27/74  Certification To*: 12/87/86  Date of Symptom Onset*: 08/15/20  Start of Care Date*: 09/25/20  Reason for Referral*: 35 yo male with left DeQuiervain's tenosynovitis.  Precautions: None  Aggravating Factors: excessive use, sudden movement, weight bearing exercise  Relieving Factors: rest, forearm massage with theragun  Patient Outcome Tool: QuickDash  Outcome Score: 18  Have you fallen in the past year?: No  Do you worry about falling?: No  Do you feel unsteady when standing or walking?: No  Fall Screen Date: 09/25/20   Based on screening, patient's fall risk is low     MECHANISM OF INJURY  Left hand, wrist and forearm pain started in August of this year. He had an injection in radial wrist (APL tendon), pain improved, but only temporarily.      PRIOR FUNCTION      Prior Level of Function  Level of Independence: Independent with ADLs and functional transfers;Independent with homemaking with ambulation  Ambulation: Independent  Vocational: Full time employment      Past Medical History:   Diagnosis Date    Migraines         FUNCTIONAL LIMITATIONS  Left hand/wrist/forearm pain results in functional limitations with:  Wash dishes  Open a jar  Weight bearing       Pain:  Has started doing yoga 20 min per day. Staying off wrists.[name]     EXAMINATION/OBJECTIVE      ROM:  B UE AROM WNL.  Stretch over web space, radial styloid and into ext wad with wrist flexion    WRIST ROM:  AROM (PROM)   RIGHT LEFT  Comment   Extension 70 75     Flexion  70 75    UD 28 30 Stretch toward styloid   RD 20 15      THUMB ROM: AROM (PROM)   R THUMB L THUMB Comment   MP      IP      PAbd      RAbd      Opposition 0.5 cm to base of SF 2.0 cm to base of SF      Sensation:   Pt reports normal sensation. Light touch intact to all fingertips.     Integumentary:  None    Edema:  DWC - Right 16.0 cm, Left 16.5 cm.    Palpation:     TTP over first dorsal interossei down toward base of MC, No TTP all other structures  TTP over ext wad more laterally,  no TTP ECRB tendon    Dexterity/Coordination:  Mildly decreased    Strength:   Right HD     Right Left Comments   Grip 95# 90# No pain   Pinch Lat 22# 22#    Pinch 3 pt 19# 18#    Grip with arm straight out in front - Right 100#, Left  95#    Special Tests:   LEFT  Resisted IF abduction - pain over dorsal radial wrist  Resisted thumb Rabd - pressure over dorsal radial wrist  Resisted wrist and MF extension - negative  Finkelsteins B - negative    Orthosis: Used OTC orthosis full time 2 weeks at a time x 2, didn't feel like it helped.    TREATMENT   Pain is no longer constant and is a bit less severe.   Heat to left wrist and forearm x 8 min  Using new heating pad at home for heat prior to ex (HEP 1-2x/day).  Also doing some exercises in between formal HEP sessions.  STM over dorsal forearm and radial wrist.  Stretches - dorsal forearm, radial forearm and first dorsal interossei  [place] over dorsal first webspace 3.3 MHz 0.8 w/cm2 x 5 min with good tolerance.  Trial of kinesiotape for left radial wrist pain.  Instructed in wear and care of kinesiotape  Written instructions given.    EDUCATION      Education  Primary Learner: Patient  Preferred Language: English  Education Preference: Verbal;Reading/Handout;Demonstration;Pictures/Handout  Method of Education: Verbal;Demonstration;Handout  Response to Education: Demonstrates independently  Barriers to Education: None  Cultural practices that influence care: None    Educated in  pertinent anatomy and anticipated progression through therapy.    ASSESSMENT   Left hand, wrist and forearm pain started in August of this year. He had an injection in radial wrist (APL tendon), pain improved, but only temporarily. Pain is causing some functional limitations. Hand therapy is medically necessary to reduce pain and increased function through heat modalities, STM, [place], stretches, custom orthosis fabrication, strengthening, HEP and pt eductiton. 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 He enjoys yoga, video games and is learning how to play the piano. Pt reports good HEP follow through. Slight improvement in pain.    GOALS      Therapy Goals  Patient Goals: Use left hand/arm without pain  Potential Barriers to Achieving Goals: None Defined    STG: 2 weeks (10/09/20)  Independent HEP.  Independent use of custom orthosis.    LTG: 8 weeks (11/20/20)  Pt to wash dishes at work with less than 3/10 pain.  Pt to open a jar using adaptive equipment with less than 3/10 pain.  Pt to do yoga with modifictions for weight bearing poses with less than 1/10 pain.      Patient requires skilled therapy to restore prior level of function utilizing the treatment and modalities described in this plan of care.     Rehab Potential: Good    PLAN       Planned Intervention(s)*: Occupational Therapy Evaluation;Orthosis Fitting/training;Patient Education;Self care / ADL Management;Therapeutic Exercise;Manual Therapy Techniques;Ultrasound         Therapy Review Date: 11/20/20    Therapy Frequency: 1/wk      Next visit: Consider custom orthosis. Cont with heat STM, stretches, [place].[name] Monitor tolerance to kinesiotape. Trial of strengthening ex next visit.      Thank you for this referral.    Joan Mayans, OTR/L, CLT, CHT

## 2020-11-05 ENCOUNTER — Ambulatory Visit (HOSPITAL_BASED_OUTPATIENT_CLINIC_OR_DEPARTMENT_OTHER): Payer: Commercial Managed Care - PPO | Admitting: Rehabilitative and Restorative Service Providers"

## 2020-11-05 DIAGNOSIS — M7712 Lateral epicondylitis, left elbow: Secondary | ICD-10-CM

## 2020-11-05 DIAGNOSIS — M654 Radial styloid tenosynovitis [de Quervain]: Secondary | ICD-10-CM

## 2020-11-05 NOTE — Progress Notes (Signed)
HAND THERAPY DISCHARGE NOTE          GENERAL VISIT INFO  Encounter Diagnoses   Name Primary?    Tenosynovitis, de Quervain Yes    Left lateral epicondylitis      Referring Provider: Thayer Headings*    Certification From*: 99/24/26  Certification To*: 83/41/96  Date of Symptom Onset*: 08/15/20  Start of Care Date*: 09/25/20  Reason for Referral*: 35 yo male with left DeQuiervain's tenosynovitis.       VISIT COUNT    5       EVALUATION       General Assessment  Certification From*: 22/29/79  Certification To*: 89/21/19  Date of Symptom Onset*: 08/15/20  Start of Care Date*: 09/25/20  Reason for Referral*: 35 yo male with left DeQuiervain's tenosynovitis.  Precautions: None  Aggravating Factors: excessive use, sudden movement, weight bearing exercise  Relieving Factors: rest, forearm massage with theragun  Patient Outcome Tool: QuickDash  Outcome Score: 18  Have you fallen in the past year?: No  Do you worry about falling?: No  Do you feel unsteady when standing or walking?: No  Fall Screen Date: 09/25/20   Based on screening, patient's fall risk is low     MECHANISM OF INJURY  Left hand, wrist and forearm pain started in August of this year. He had an injection in radial wrist (APL tendon), pain improved, but only temporarily.      PRIOR FUNCTION      Prior Level of Function  Level of Independence: Independent with ADLs and functional transfers;Independent with homemaking with ambulation  Ambulation: Independent  Vocational: Full time employment      Past Medical History:   Diagnosis Date    Migraines         FUNCTIONAL LIMITATIONS  Left hand/wrist/forearm pain results in functional limitations with:  Wash dishes  Open a jar  Weight bearing       Pain:  Has started doing yoga 20 min per day. Staying off wrists. No pain in elbow unless it is pushed. 0-3 in wrist. He has been really trying to be careful with wrist/thumb use.     EXAMINATION/OBJECTIVE      ROM:  B UE AROM WNL.  Stretch over web space, radial  styloid and into ext wad with wrist flexion    WRIST ROM:  AROM (PROM)   RIGHT LEFT  Comment   Extension 70 75    Flexion  70 75    UD 28 30 Stretch toward styloid   RD 20 15      THUMB ROM: AROM (PROM)   R THUMB L THUMB Comment   MP      IP      PAbd      RAbd      Opposition 0.5 cm to base of SF 2.0 cm to base of SF      Sensation:   Pt reports normal sensation. Light touch intact to all fingertips.     Integumentary:  None    Edema:  DWC - Right 16.0 cm, Left 16.5 cm.    Palpation:     TTP over first dorsal interossei down toward base of MC, No TTP all other structures  TTP over ext wad more laterally,  no TTP ECRB tendon    Dexterity/Coordination:  Mildly decreased    Strength:   Right HD     Right Left Comments   Grip 95# 90# No pain   Pinch Lat 22# 22#  Pinch 3 pt 19# 18#    Grip with arm straight out in front - Right 100#, Left 95#    No pain    Special Tests:   LEFT  Resisted IF abduction - pain over dorsal radial wrist  Resisted thumb Rabd - pressure over dorsal radial wrist  Resisted wrist and MF extension - negative  Finkelsteins B - negative    Orthosis: Used OTC orthosis full time 2 weeks at a time x 2, didn't feel like it helped.    TREATMENT   Pt reports that he needs to stop coming due to insurance issues  Heat to left wrist and forearm x 8 min  Using new heating pad at home for heat prior to ex (HEP 1-2x/day).  Also doing some exercises in between formal HEP sessions.  STM over dorsal forearm and radial wrist.  Stretches - dorsal forearm, radial forearm and first dorsal interossei  [place] over dorsal first webspace 3.3 MHz 0.8 w/cm2 x 5 min with good tolerance.  Pt wants to know about strengthening exercises today so that he can do them at home.  Issued red putty for grips, pinches and intrinsic plus. Demo'd putty in right hand and will start putty in left hand when pain is 0-1/10.  Written instructions given.      EDUCATION      Education  Primary Learner: Patient  Preferred Language:  English  Education Preference: Verbal;Reading/Handout;Demonstration;Pictures/Handout  Method of Education: Verbal;Demonstration;Handout  Response to Education: Demonstrates independently  Barriers to Education: None  Cultural practices that influence care: None    Educated in pertinent anatomy and anticipated progression through therapy.    ASSESSMENT   Left hand, wrist and forearm pain started in August of this year. He had an injection in radial wrist (APL tendon), pain improved, but only temporarily. Pain is causing some functional limitations. Hand therapy is medically necessary to reduce pain and increased function through heat modalities, STM, [place], stretches, custom orthosis fabrication, strengthening, HEP and pt eductiton. 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 He enjoys yoga, video games and is learning how to play the piano. Pain is improving with increasing function. Discharged today due to pt reports of insurance issues.    GOALS      Therapy Goals  Patient Goals: Use left hand/arm without pain  Potential Barriers to Achieving Goals: None Defined    STG: 2 weeks (10/09/20)  Independent HEP.   MET  Independent use of custom orthosis.   NA    LTG: 8 weeks (11/20/20)  Pt to wash dishes at work with less than 3/10 pain.    MET  Pt to open a jar using adaptive equipment with less than 3/10 pain.   MET  Pt to do yoga with modifictions for weight bearing poses with less than 1/10 pain.   MET      Patient requires skilled therapy to restore prior level of function utilizing the treatment and modalities described in this plan of care.     Rehab Potential: Good    PLAN       Planned Intervention(s)*: Occupational Therapy Evaluation;Orthosis Fitting/training;Patient Education;Self care / ADL Management;Therapeutic Exercise;Manual Therapy Techniques;Ultrasound        Next visit: Goals have been met. Pt to continue with HEP adding  strengthening as discussed. Discharge therapy.      Thank you for this referral.    Nevin Bloodgood  Tamala Julian, OTR/L, CLT, CHT

## 2020-11-12 ENCOUNTER — Encounter (HOSPITAL_BASED_OUTPATIENT_CLINIC_OR_DEPARTMENT_OTHER): Payer: Self-pay | Admitting: Rehabilitative and Restorative Service Providers"

## 2020-11-19 ENCOUNTER — Encounter (HOSPITAL_BASED_OUTPATIENT_CLINIC_OR_DEPARTMENT_OTHER): Payer: Self-pay | Admitting: Rehabilitative and Restorative Service Providers"

## 2021-08-03 ENCOUNTER — Other Ambulatory Visit: Payer: Self-pay

## 2021-11-24 ENCOUNTER — Encounter (INDEPENDENT_AMBULATORY_CARE_PROVIDER_SITE_OTHER): Payer: Self-pay

## 2022-08-06 ENCOUNTER — Other Ambulatory Visit: Payer: Self-pay

## 2023-03-10 ENCOUNTER — Emergency Department: Payer: Self-pay

## 2023-06-08 ENCOUNTER — Emergency Department: Payer: Self-pay

## 2023-07-19 ENCOUNTER — Emergency Department: Payer: Self-pay

## 2023-08-02 ENCOUNTER — Other Ambulatory Visit: Payer: Self-pay

## 7364-01-17 DEATH — deceased
